# Patient Record
Sex: Female | Born: 1985 | Hispanic: No | Marital: Single | Smoking: Never smoker
Health system: Southern US, Community
[De-identification: ages and names within clinical notes are randomized; demographics above are authoritative.]

## PROBLEM LIST (undated history)

## (undated) DIAGNOSIS — R87612 Low grade squamous intraepithelial lesion on cytologic smear of cervix (LGSIL): Secondary | ICD-10-CM

## (undated) DIAGNOSIS — R87810 Cervical high risk human papillomavirus (HPV) DNA test positive: Secondary | ICD-10-CM

## (undated) DIAGNOSIS — N879 Dysplasia of cervix uteri, unspecified: Secondary | ICD-10-CM

## (undated) DIAGNOSIS — I10 Essential (primary) hypertension: Secondary | ICD-10-CM

## (undated) DIAGNOSIS — E041 Nontoxic single thyroid nodule: Secondary | ICD-10-CM

## (undated) DIAGNOSIS — R87619 Unspecified abnormal cytological findings in specimens from cervix uteri: Secondary | ICD-10-CM

## (undated) DIAGNOSIS — E7800 Pure hypercholesterolemia, unspecified: Secondary | ICD-10-CM

## (undated) DIAGNOSIS — R87610 Atypical squamous cells of undetermined significance on cytologic smear of cervix (ASC-US): Secondary | ICD-10-CM

## (undated) DIAGNOSIS — Z803 Family history of malignant neoplasm of breast: Secondary | ICD-10-CM

## (undated) DIAGNOSIS — E785 Hyperlipidemia, unspecified: Secondary | ICD-10-CM

## (undated) HISTORY — DX: Cervical high risk human papillomavirus (HPV) DNA test positive: R87.810

## (undated) HISTORY — PX: COLPOSCOPY: SHX161

## (undated) HISTORY — DX: Nontoxic single thyroid nodule: E04.1

## (undated) HISTORY — DX: Unspecified abnormal cytological findings in specimens from cervix uteri: R87.619

## (undated) HISTORY — DX: Atypical squamous cells of undetermined significance on cytologic smear of cervix (ASC-US): R87.610

## (undated) HISTORY — DX: Family history of malignant neoplasm of breast: Z80.3

## (undated) HISTORY — PX: CRYOTHERAPY: SHX1416

## (undated) HISTORY — DX: Essential (primary) hypertension: I10

## (undated) HISTORY — DX: Low grade squamous intraepithelial lesion on cytologic smear of cervix (LGSIL): R87.612

## (undated) HISTORY — DX: Pure hypercholesterolemia, unspecified: E78.00

## (undated) HISTORY — DX: Dysplasia of cervix uteri, unspecified: N87.9

## (undated) HISTORY — DX: Hyperlipidemia, unspecified: E78.5

---
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
## 2008-03-01 ENCOUNTER — Emergency Department: Payer: Self-pay | Admitting: Emergency Medicine

## 2008-03-01 ENCOUNTER — Other Ambulatory Visit: Payer: Self-pay

## 2008-03-12 ENCOUNTER — Encounter: Admission: RE | Admit: 2008-03-12 | Discharge: 2008-03-12 | Payer: Self-pay | Admitting: Family Medicine

## 2009-05-26 IMAGING — US US SOFT TISSUE HEAD/NECK
1 series · 14 of 25 positions shown · non-contrast
Comparison: None

CLINICAL DATA: Enlarged thyroid on physical exam

ULTRASOUND OF HEAD/NECK SOFT TISSUES
TECHNIQUE: Ultrasound examination of the head and neck soft tissues
was performed in the area of clinical concern.

[Series 1: us soft tissue head/neck · 0.08mm/px · 14 of 27 slices shown]
[im 1/27]
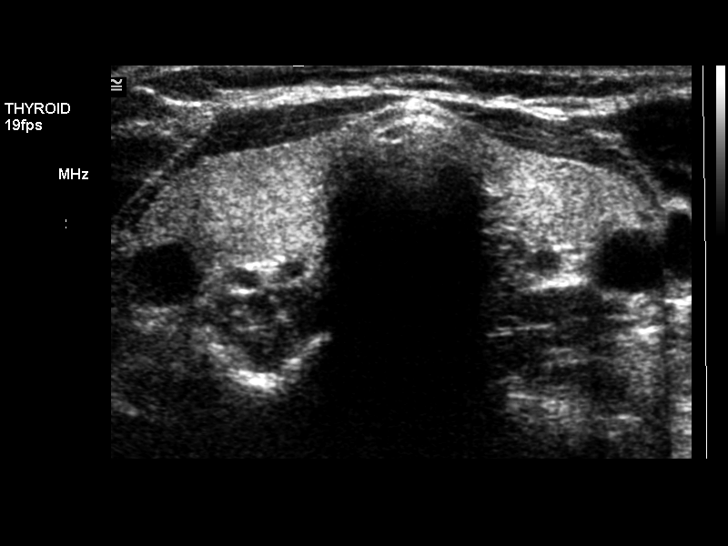
[im 3/27]
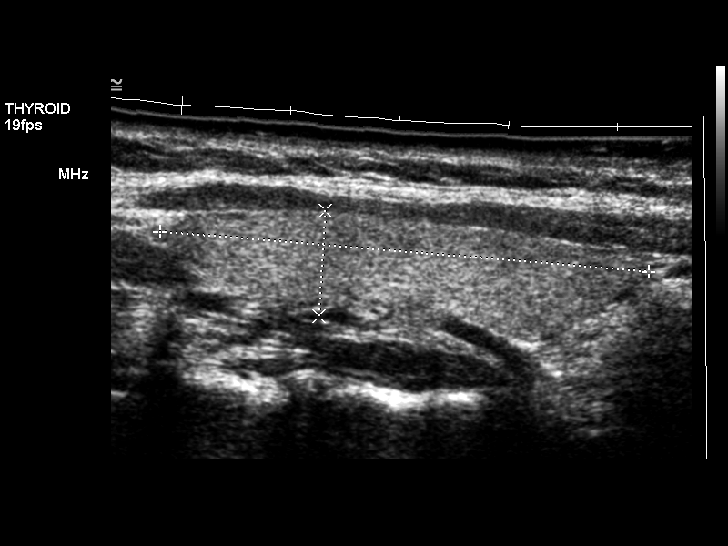
[im 5/27]
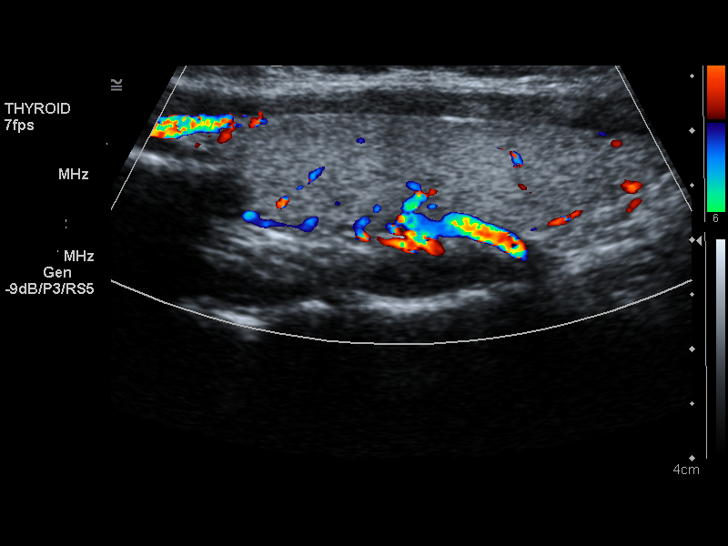
[im 7/27]
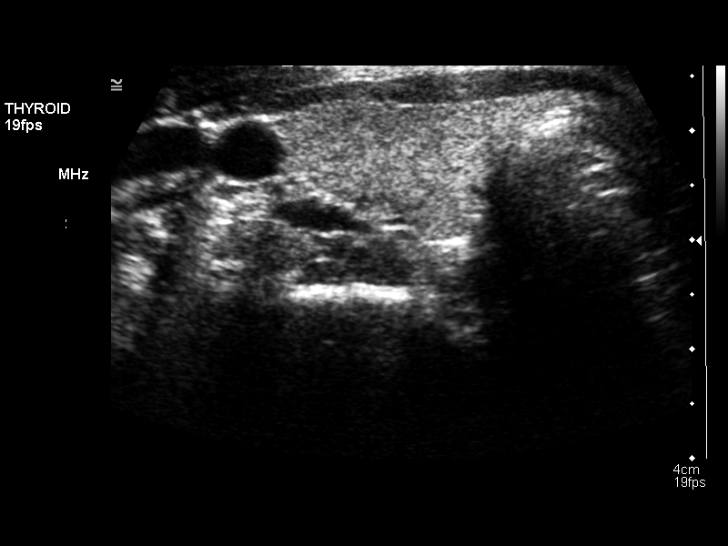
[im 9/27]
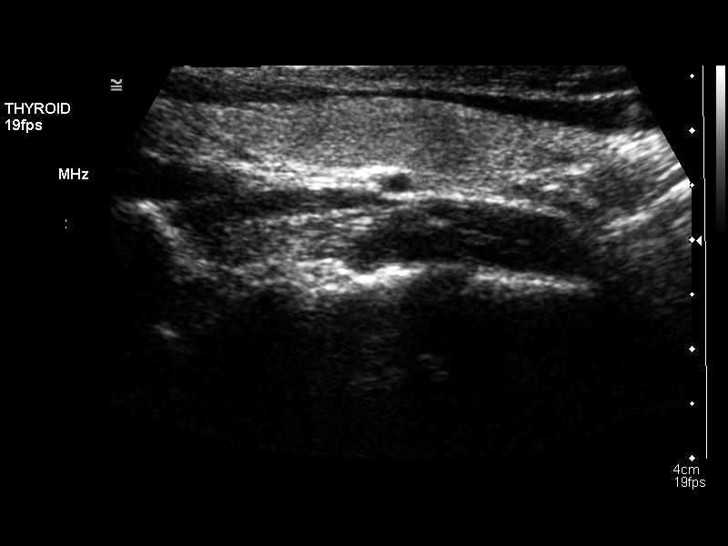
[im 10/27]
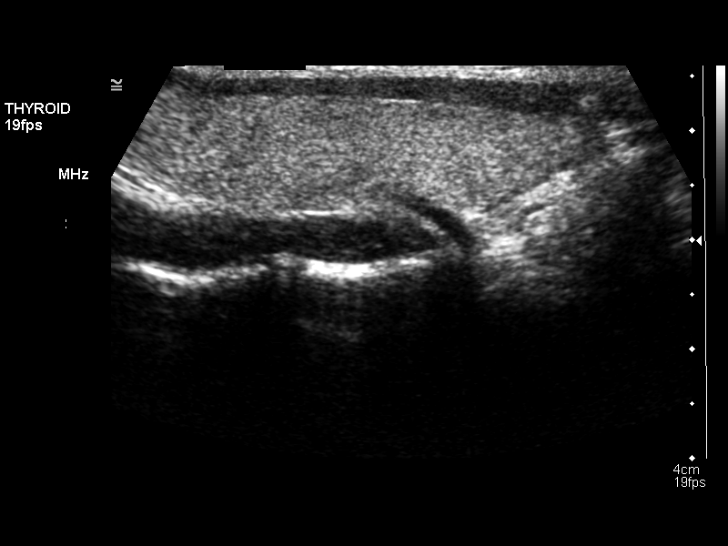
[im 12/27]
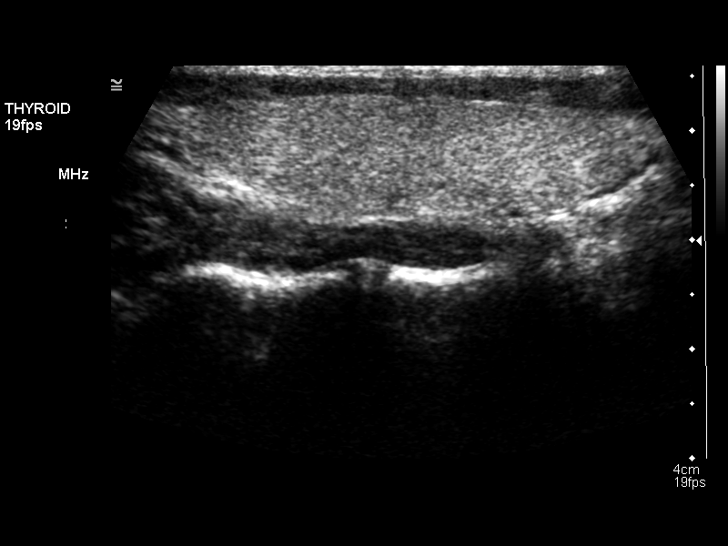
[im 15/27]
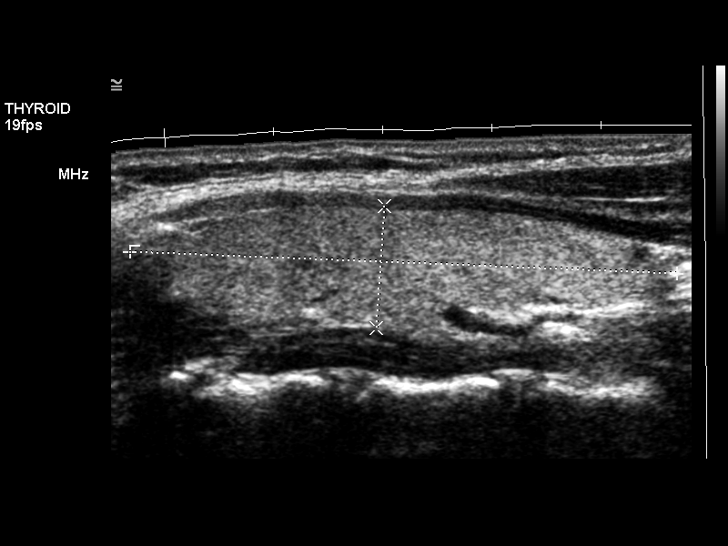
[im 17/27]
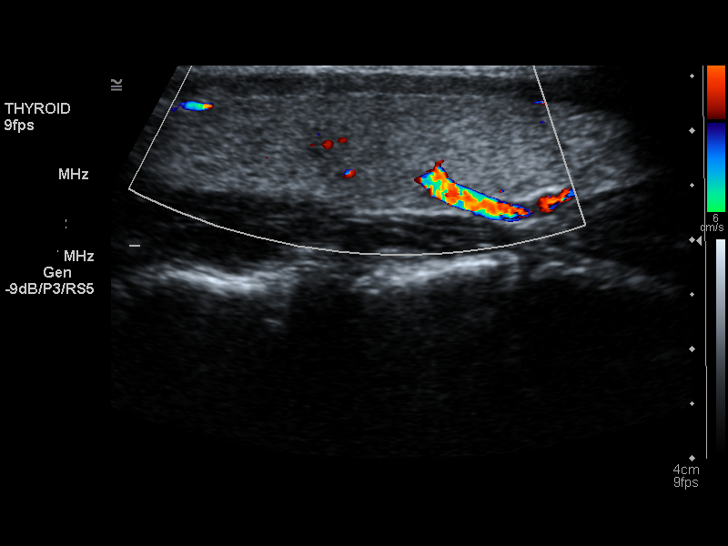
[im 18/27]
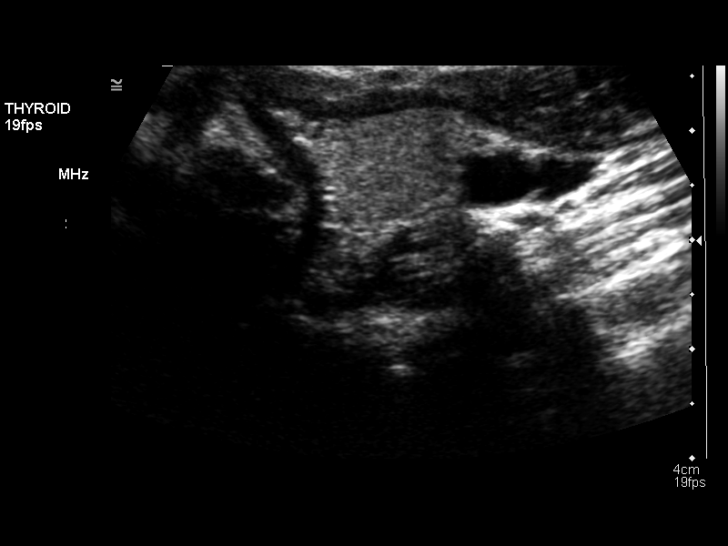
[im 20/27]
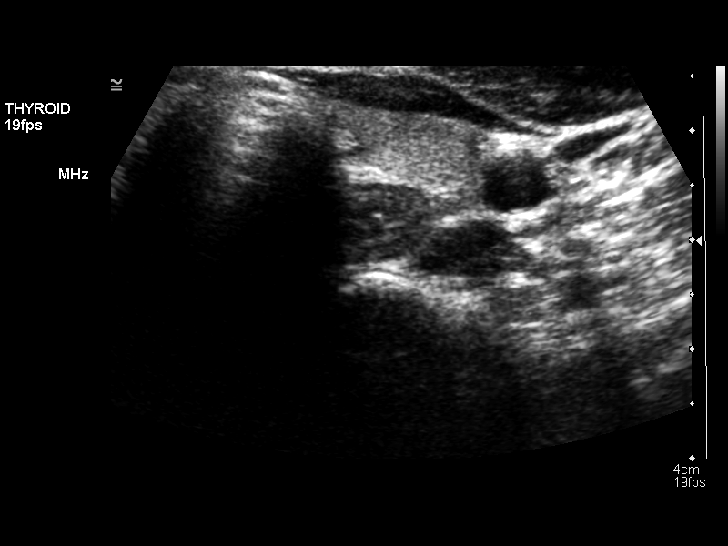
[im 22/27]
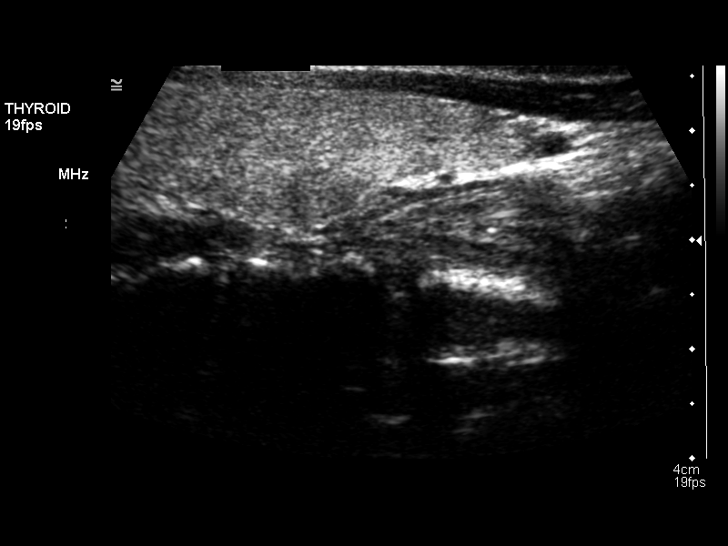
[im 24/27]
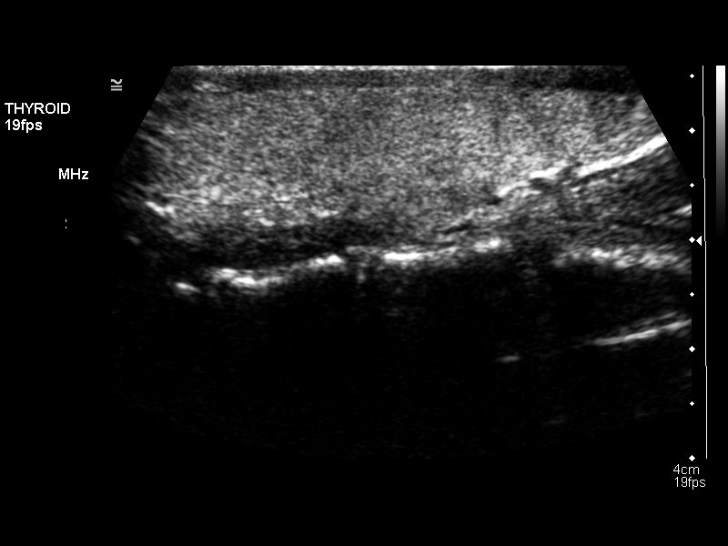
[im 27/27]
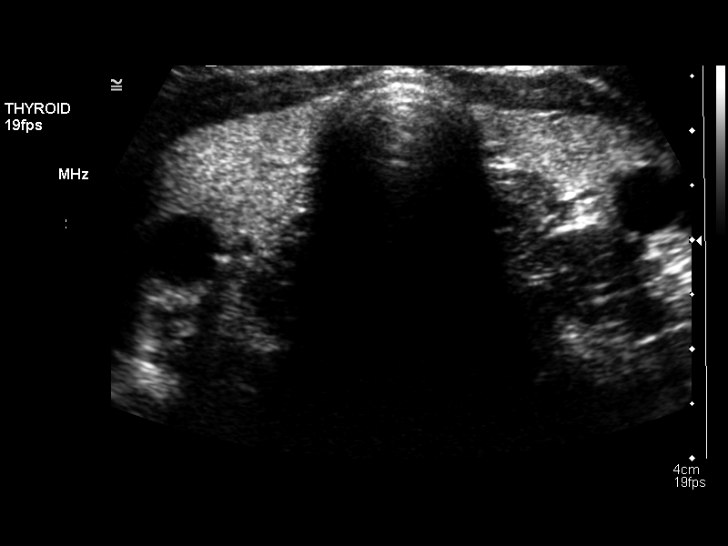

[14 of 25 positions shown; findings below may reference images not displayed]

FINDINGS: The thyroid gland is within normal limits in size.  The
right lobe measures 4.5 cm sagittally with a depth of 1.0 cm in
width of 2.0 cm.  The left lobe measures 5.0 x 1.1 x 1.9 cm with
the isthmus measuring 2.5 mm.  The gland is homogeneous and no
solid or cystic nodule is seen.
IMPRESSION: Thyroid gland is within normal limits in size with no solid or
cystic nodule.

## 2011-08-15 ENCOUNTER — Emergency Department: Payer: Self-pay | Admitting: Emergency Medicine

## 2013-11-13 ENCOUNTER — Emergency Department: Payer: Self-pay | Admitting: Emergency Medicine

## 2013-11-13 LAB — CBC
HCT: 37.7 % (ref 35.0–47.0)
HGB: 13 g/dL (ref 12.0–16.0)
MCH: 31.6 pg (ref 26.0–34.0)
MCHC: 34.4 g/dL (ref 32.0–36.0)
MCV: 92 fL (ref 80–100)
Platelet: 293 10*3/uL (ref 150–440)
RBC: 4.11 10*6/uL (ref 3.80–5.20)
RDW: 12.2 % (ref 11.5–14.5)
WBC: 7.8 10*3/uL (ref 3.6–11.0)

## 2013-11-13 LAB — BASIC METABOLIC PANEL
Anion Gap: 3 — ABNORMAL LOW (ref 7–16)
BUN: 13 mg/dL (ref 7–18)
Calcium, Total: 9.7 mg/dL (ref 8.5–10.1)
Chloride: 105 mmol/L (ref 98–107)
Co2: 27 mmol/L (ref 21–32)
Creatinine: 0.76 mg/dL (ref 0.60–1.30)
EGFR (African American): >60
EGFR (Non-African Amer.): >60
Glucose: 104 mg/dL — ABNORMAL HIGH (ref 65–99)
Osmolality: 271 (ref 275–301)
Potassium: 3.7 mmol/L (ref 3.5–5.1)
Sodium: 135 mmol/L — ABNORMAL LOW (ref 136–145)

## 2013-11-13 LAB — TROPONIN I: Troponin-I: <0.02 ng/mL

## 2014-03-26 ENCOUNTER — Ambulatory Visit: Payer: Self-pay | Admitting: Nurse Practitioner

## 2015-09-03 ENCOUNTER — Other Ambulatory Visit: Payer: Self-pay | Admitting: Obstetrics and Gynecology

## 2015-09-03 DIAGNOSIS — E010 Iodine-deficiency related diffuse (endemic) goiter: Secondary | ICD-10-CM

## 2015-09-06 ENCOUNTER — Other Ambulatory Visit: Payer: Self-pay

## 2015-09-10 ENCOUNTER — Ambulatory Visit
Admission: RE | Admit: 2015-09-10 | Discharge: 2015-09-10 | Disposition: A | Discharge status: Home / Self Care | Payer: No Typology Code available for payment source | Source: Ambulatory Visit | Attending: Obstetrics and Gynecology | Admitting: Obstetrics and Gynecology

## 2015-09-10 DIAGNOSIS — E049 Nontoxic goiter, unspecified: Secondary | ICD-10-CM | POA: Diagnosis not present

## 2015-09-10 DIAGNOSIS — E010 Iodine-deficiency related diffuse (endemic) goiter: Secondary | ICD-10-CM

## 2016-03-03 LAB — HM PAP SMEAR: HM PAP: ABNORMAL

## 2016-11-24 ENCOUNTER — Other Ambulatory Visit: Payer: Self-pay | Admitting: Obstetrics and Gynecology

## 2016-11-24 DIAGNOSIS — E010 Iodine-deficiency related diffuse (endemic) goiter: Secondary | ICD-10-CM

## 2016-11-27 ENCOUNTER — Ambulatory Visit: Payer: No Typology Code available for payment source

## 2016-12-01 ENCOUNTER — Ambulatory Visit
Admission: RE | Admit: 2016-12-01 | Discharge: 2016-12-01 | Disposition: A | Discharge status: Home / Self Care | Payer: Self-pay | Source: Ambulatory Visit | Attending: Obstetrics and Gynecology | Admitting: Obstetrics and Gynecology

## 2016-12-01 DIAGNOSIS — E010 Iodine-deficiency related diffuse (endemic) goiter: Secondary | ICD-10-CM

## 2016-12-01 DIAGNOSIS — E049 Nontoxic goiter, unspecified: Secondary | ICD-10-CM | POA: Insufficient documentation

## 2017-06-15 IMAGING — US US SOFT TISSUE HEAD/NECK
1 series · 14 of 25 positions shown · non-contrast
Comparison: 03/26/2014

CLINICAL DATA: Thyromegaly.

EXAM:
THYROID ULTRASOUND
TECHNIQUE: Ultrasound examination of the thyroid gland and adjacent soft
tissues was performed.

[Series 1: us soft tissue head/neck · 0.07mm/px · 14 of 41 slices shown]
[im 1/41]
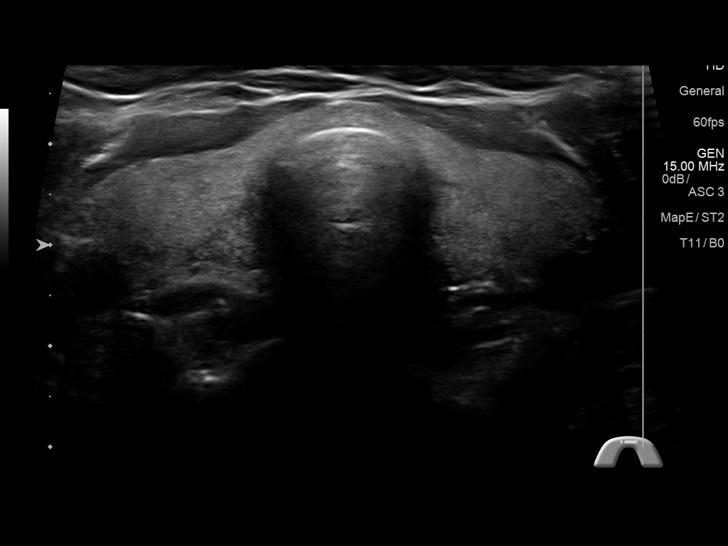
[im 4/41]
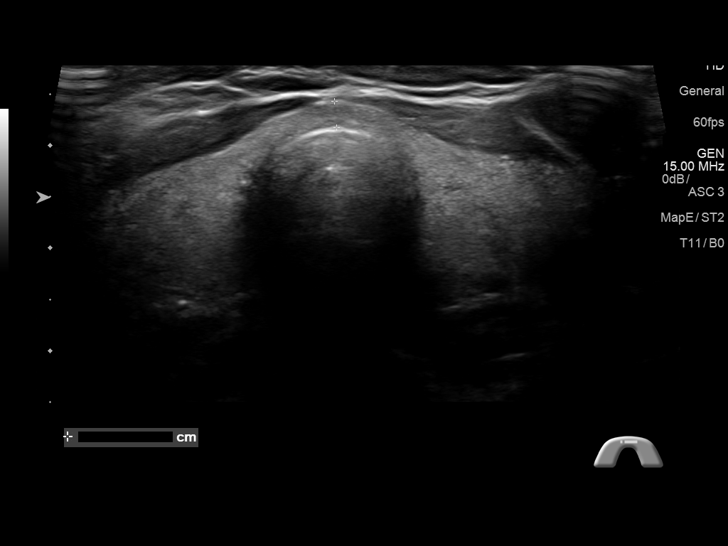
[im 7/41]
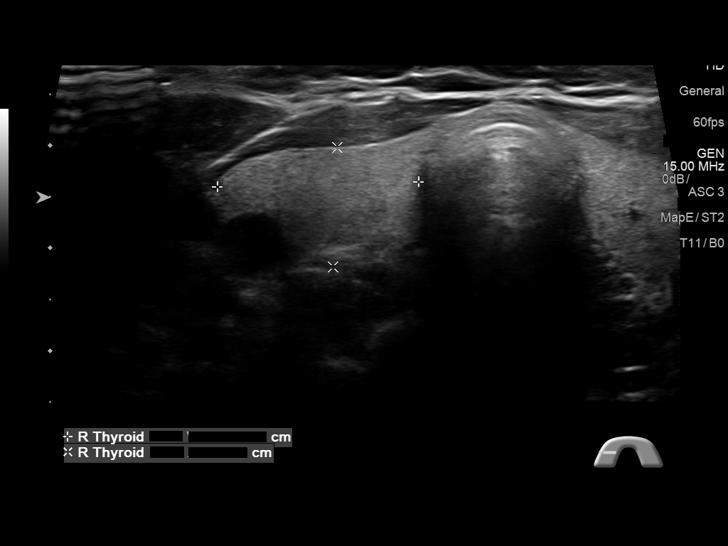
[im 11/41]
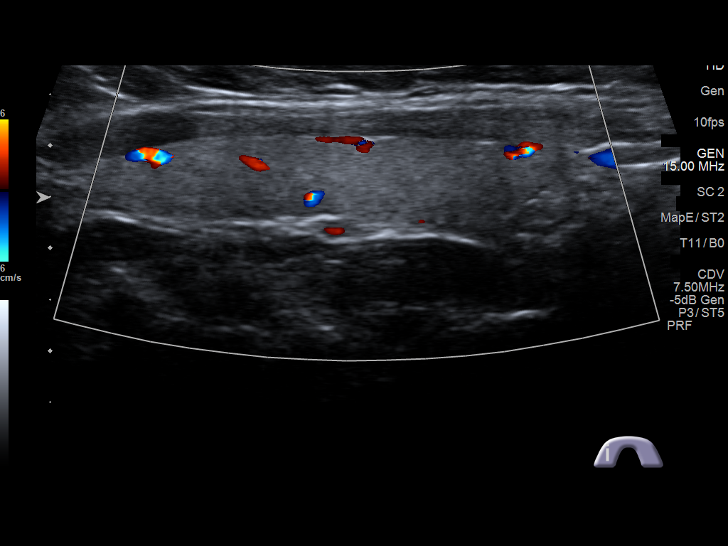
[im 14/41]
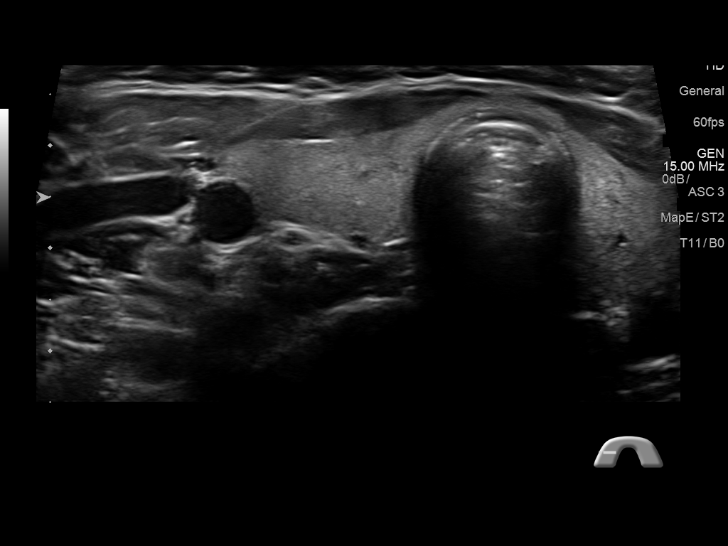
[im 16/41]
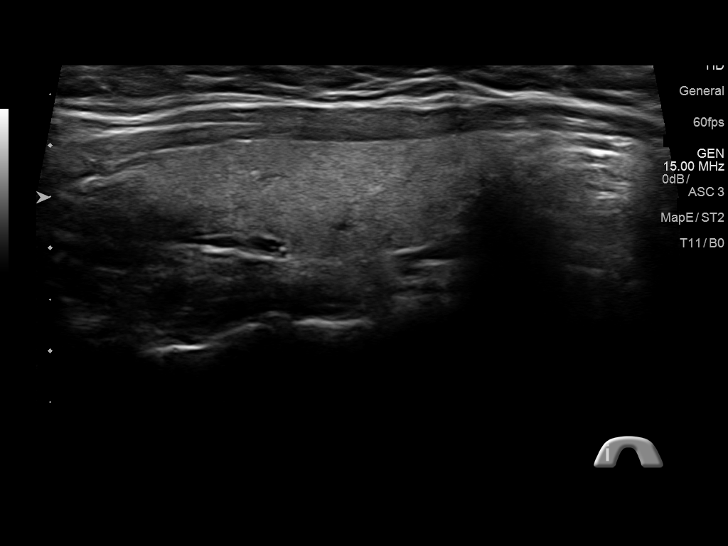
[im 19/41]
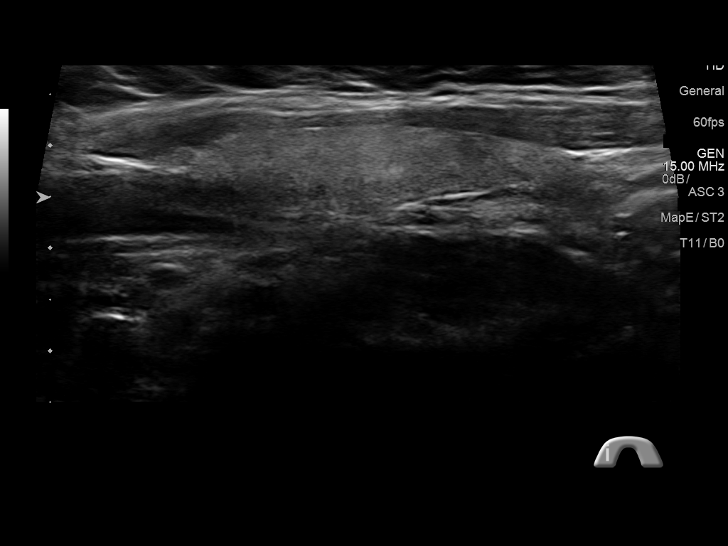
[im 22/41]
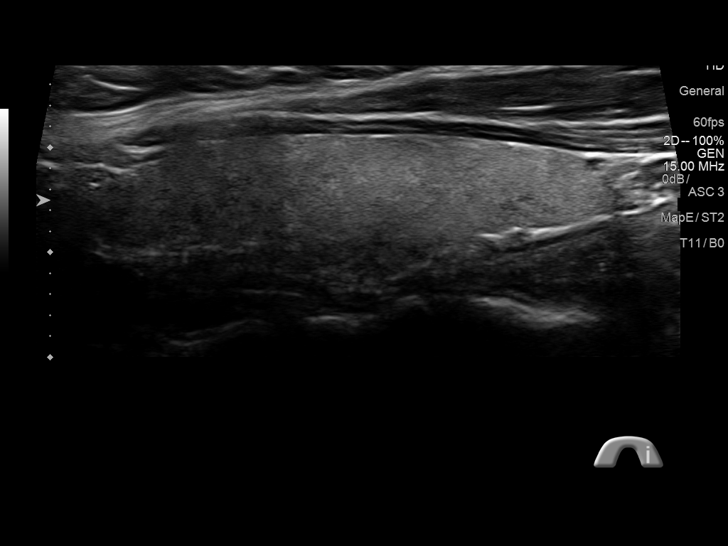
[im 26/41]
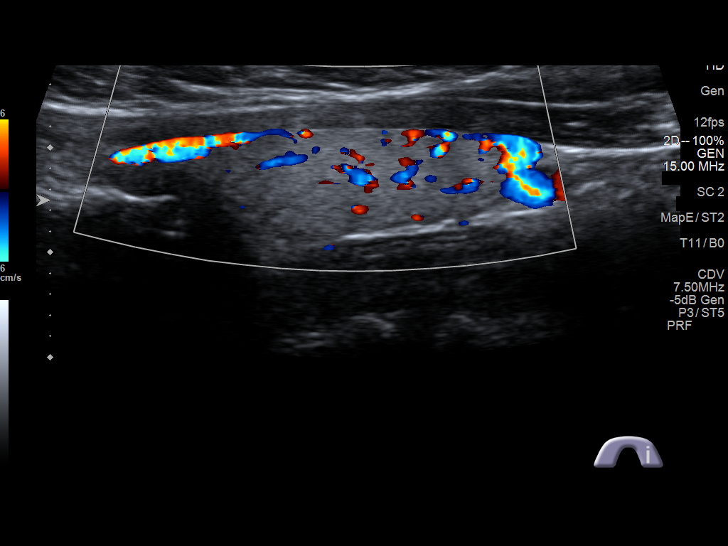
[im 27/41]
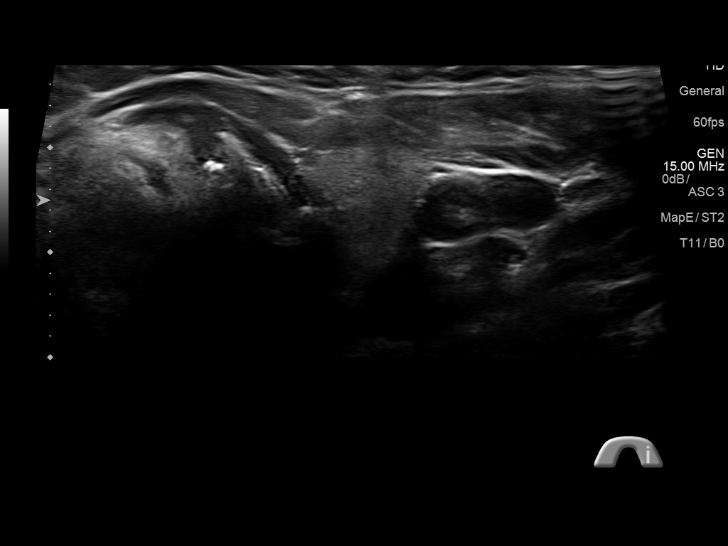
[im 31/41]
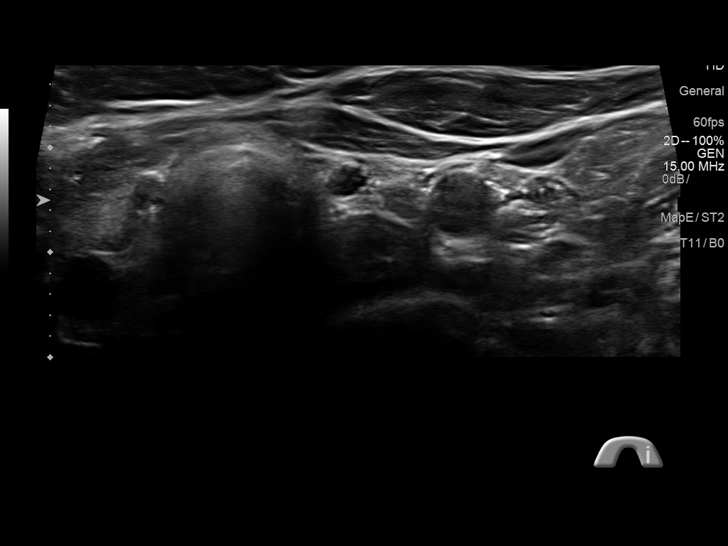
[im 34/41]
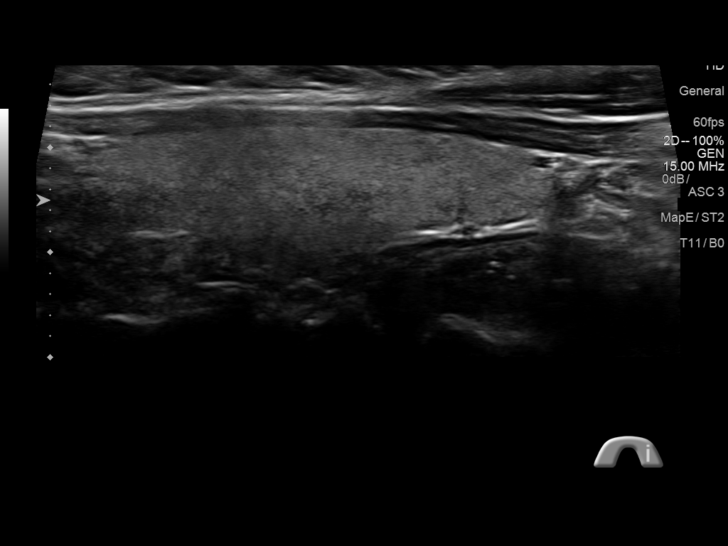
[im 37/41]
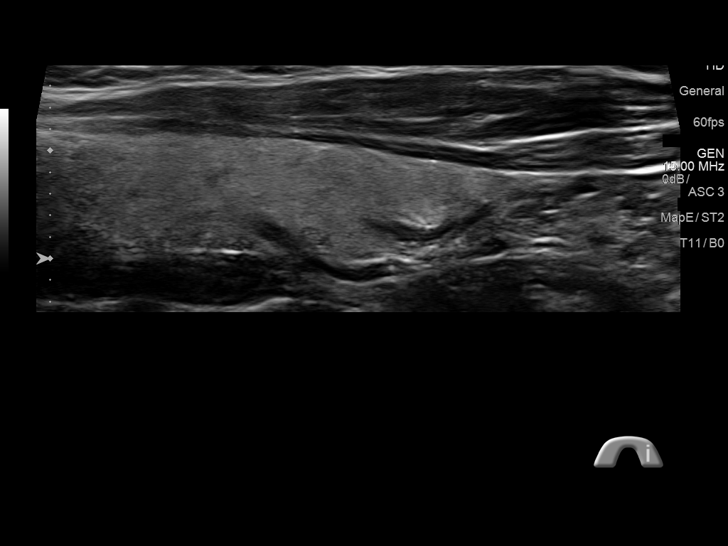
[im 41/41]
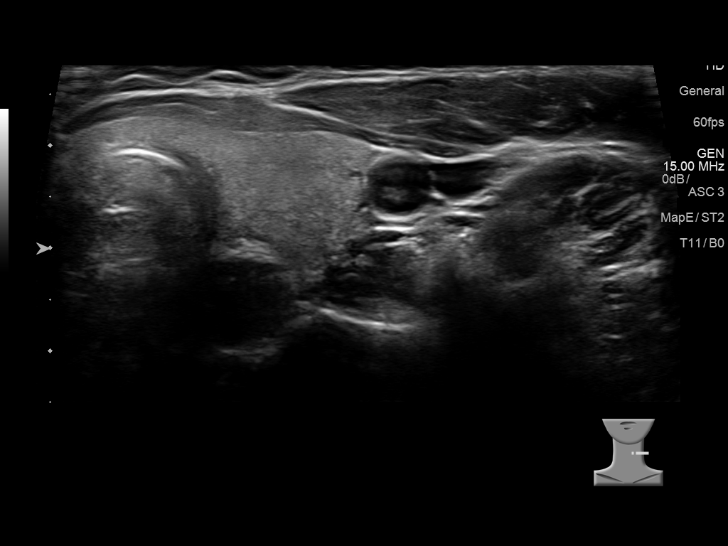

[14 of 25 positions shown; findings below may reference images not displayed]

FINDINGS: Right thyroid lobe

Measurements: 4.7 x 1.2 x 2.0 cm. Right thyroid lobe is homogeneous
without a distinct nodule.

Left thyroid lobe

Measurements: 5.2 x 1.4 x 1.9 cm. There is a stable hyperechoic area
along the inferior aspect of the left thyroid lobe with ill-defined
margins. Unclear if this represents a true nodule. This area
measures up to 0.5 cm and minimally changed.

Isthmus

Thickness: 0.3 cm.  No nodules visualized.

Lymphadenopathy

None visualized.
IMPRESSION: Stable small hyperechoic area along the inferior left thyroid lobe.
This small structure is not well defined and may represent a pseudo
nodule.

## 2017-12-09 ENCOUNTER — Telehealth: Payer: Self-pay | Admitting: Obstetrics and Gynecology

## 2017-12-09 ENCOUNTER — Encounter: Payer: Self-pay | Admitting: Obstetrics and Gynecology

## 2017-12-09 ENCOUNTER — Ambulatory Visit (INDEPENDENT_AMBULATORY_CARE_PROVIDER_SITE_OTHER): Payer: No Typology Code available for payment source | Admitting: Obstetrics and Gynecology

## 2017-12-09 VITALS — BP 148/80 | HR 111 | Ht 62.0 in | Wt 152.0 lb

## 2017-12-09 DIAGNOSIS — Z803 Family history of malignant neoplasm of breast: Secondary | ICD-10-CM | POA: Insufficient documentation

## 2017-12-09 DIAGNOSIS — Z1151 Encounter for screening for human papillomavirus (HPV): Secondary | ICD-10-CM

## 2017-12-09 DIAGNOSIS — Z01419 Encounter for gynecological examination (general) (routine) without abnormal findings: Secondary | ICD-10-CM

## 2017-12-09 DIAGNOSIS — E041 Nontoxic single thyroid nodule: Secondary | ICD-10-CM | POA: Diagnosis not present

## 2017-12-09 DIAGNOSIS — Z87898 Personal history of other specified conditions: Secondary | ICD-10-CM | POA: Diagnosis not present

## 2017-12-09 DIAGNOSIS — Z8742 Personal history of other diseases of the female genital tract: Secondary | ICD-10-CM

## 2017-12-09 DIAGNOSIS — A630 Anogenital (venereal) warts: Secondary | ICD-10-CM | POA: Diagnosis not present

## 2017-12-09 DIAGNOSIS — Z124 Encounter for screening for malignant neoplasm of cervix: Secondary | ICD-10-CM | POA: Diagnosis not present

## 2017-12-09 NOTE — Telephone Encounter (Signed)
Insurance came back twice saying inactive. Pt called [employer] and I spoke with Roxanne and she verified she does have insurance and it is active. Pt is aware if bill is sent to contact her insurance company.

## 2017-12-09 NOTE — Progress Notes (Signed)
PCP:  Patient, No Pcp Per   Chief Complaint  Patient presents with  . Gynecologic Exam     HPI:      [name] is a 31 y.o. No obstetric history on file. who LMP was Patient's last menstrual period was 12/05/2017., presents today for her annual examination.  Her menses are regular every 28-30 days, lasting 5 days.  Dysmenorrhea mild. She does not have intermenstrual bleeding.  Sex activity: not sexually active.  Last Pap: November 24, 2016  Results were: no abnormalities /POS HPV DNA. Pt with LGSIL and neg bx 4/17. Repeat pap due today. Hx of STDs: HPV. Pt notes 2 bumps on mons for the past yr.  There is a FH of breast cancer in her mat aunt, in 2640s or 4550s. 33 yo sister had neg genetic testing done in the past but recently had abn mammo and is sched for bx 1/19. There is no FH of ovarian cancer. The patient does do self-breast exams.  Tobacco use: The patient denies current or previous tobacco use. Alcohol use: none No drug use.  Exercise: not active  She does get adequate calcium but not Vitamin D in her diet.   Pt with hx of thyromegaly and thyroid nodules 2015/2016. Had u/s f/u last yr that was normal without thyromegaly/nodules.    Past Medical History:  Diagnosis Date  . Abnormal Pap smear of cervix   . ASCUS with positive high risk HPV cervical   . Cervical dysplasia   . LGSIL on Pap smear of cervix   . Thyroid nodule     Past Surgical History:  Procedure Laterality Date  . COLPOSCOPY    . CRYOTHERAPY      Family History  Problem Relation Age of Onset  . Hypertension Mother   . Thyroid disease Mother   . Multiple sclerosis Sister   . Breast cancer Maternal Aunt        40s or 7150s  . Colon cancer Paternal Aunt     Social History   Socioeconomic History  . Marital status: Single    Spouse name: Not on file  . Number of children: Not on file  . Years of education: Not on file  . Highest education level: Not on file  Social Needs  . Financial  resource strain: Not on file  . Food insecurity - worry: Not on file  . Food insecurity - inability: Not on file  . Transportation needs - medical: Not on file  . Transportation needs - non-medical: Not on file  Occupational History  . Not on file  Tobacco Use  . Smoking status: Never Smoker  . Smokeless tobacco: Never Used  Substance and Sexual Activity  . Alcohol use: No    Frequency: Never  . Drug use: No  . Sexual activity: Not Currently    Birth control/protection: None  Other Topics Concern  . Not on file  Social History Narrative  . Not on file    No outpatient medications have been marked as taking for the 12/09/17 encounter (Office Visit) with Copland, Ilona SorrelAlicia B, PA-C.     ROS:  Review of Systems  Constitutional: Negative for fatigue, fever and unexpected weight change.  Respiratory: Negative for cough, shortness of breath and wheezing.   Cardiovascular: Negative for chest pain, palpitations and leg swelling.  Gastrointestinal: Negative for blood in stool, constipation, diarrhea, nausea and vomiting.  Endocrine: Negative for cold intolerance, heat intolerance and polyuria.  Genitourinary: Negative for  dyspareunia, dysuria, flank pain, frequency, genital sores, hematuria, menstrual problem, pelvic pain, urgency, vaginal bleeding, vaginal discharge and vaginal pain.  Musculoskeletal: Negative for back pain, joint swelling and myalgias.  Skin: Negative for rash.  Neurological: Positive for headaches. Negative for dizziness, syncope, light-headedness and numbness.  Hematological: Negative for adenopathy.  Psychiatric/Behavioral: Negative for agitation, confusion, sleep disturbance and suicidal ideas. The patient is not nervous/anxious.      Objective: BP (!) 148/80   Pulse (!) 111   Ht 5\' 2"  (1.575 m)   Wt 152 lb (68.9 kg)   LMP 12/05/2017   BMI 27.80 kg/m    Physical Exam  Constitutional: She is oriented to person, place, and time. She appears well-developed  and well-nourished.  Genitourinary: Vagina normal and uterus normal. There is no rash or tenderness on the right labia. There is no rash or tenderness on the left labia.    No erythema or tenderness in the vagina. No vaginal discharge found. Right adnexum does not display mass and does not display tenderness. Left adnexum does not display mass and does not display tenderness. Cervix does not exhibit motion tenderness or polyp. Uterus is not enlarged or tender.  Genitourinary Comments: 2 WARTY LESIONS ON MONS  Neck: Normal range of motion. No thyromegaly present.  Cardiovascular: Normal rate, regular rhythm and normal heart sounds.  No murmur heard. Pulmonary/Chest: Effort normal and breath sounds normal. Right breast exhibits no mass, no nipple discharge, no skin change and no tenderness. Left breast exhibits no mass, no nipple discharge, no skin change and no tenderness.  Abdominal: Soft. There is no tenderness. There is no guarding.  Musculoskeletal: Normal range of motion.  Neurological: She is alert and oriented to person, place, and time. No cranial nerve deficit.  Psychiatric: She has a normal mood and affect. Her behavior is normal.  Vitals reviewed.  Assessment/Plan: Encounter for annual routine gynecological examination  Cervical cancer screening - Plan: IGP, Aptima HPV  Screening for HPV (human papillomavirus) - Plan: IGP, Aptima HPV  History of abnormal cervical Pap smear - Repeat pap today. Will f/u with results. If abn, pt due for repeat colpo.  Family history of breast cancer - Pt to clarify age of dx of mat aunt with breast cancer, as well as f/u on sister's bx report. Pt will f/u with me and will do MyRisk testing if qualifies.  Genital warts - Treated with TCA today. Wash in 4-6hrs. F/u prn.   Thyroid nodule - Resolved on thryoid u/s 12/17.  Nothing further to do at this time.    GYN counsel breast self exam, adequate intake of calcium and vitamin D, diet and  exercise     F/U  Return in about 1 year (around 12/09/2018).  Alicia B. Copland, PA-C 12/09/2017 10:57 AM

## 2017-12-09 NOTE — Patient Instructions (Signed)
I value your feedback and entrusting us with your care. If you get a [facility] patient survey, I would appreciate you taking the time to let us know about your experience today. Thank you! 

## 2017-12-11 LAB — IGP, APTIMA HPV
HPV APTIMA: NEGATIVE
PAP Smear Comment: 0

## 2018-02-21 ENCOUNTER — Telehealth: Payer: Self-pay

## 2018-02-21 NOTE — Telephone Encounter (Signed)
Pt calling to see if we can see her for a sinus inf.  [phone].  Left detailed msg to see PCP, [facility], or Urgent Care as we do not do primary care.

## 2018-09-06 IMAGING — US US THYROID
1 series · 13 of 25 positions shown · non-contrast
Comparison: Thyroid ultrasound - 09/10/2015; 03/26/2014; 03/12/2008

CLINICAL DATA: Other.  Thyromegaly.

EXAM:
THYROID ULTRASOUND
TECHNIQUE: Ultrasound examination of the thyroid gland and adjacent soft
tissues was performed.

[Series 1: us thyroid · 0.07mm/px · 13 of 59 slices shown]
[im 1/59]
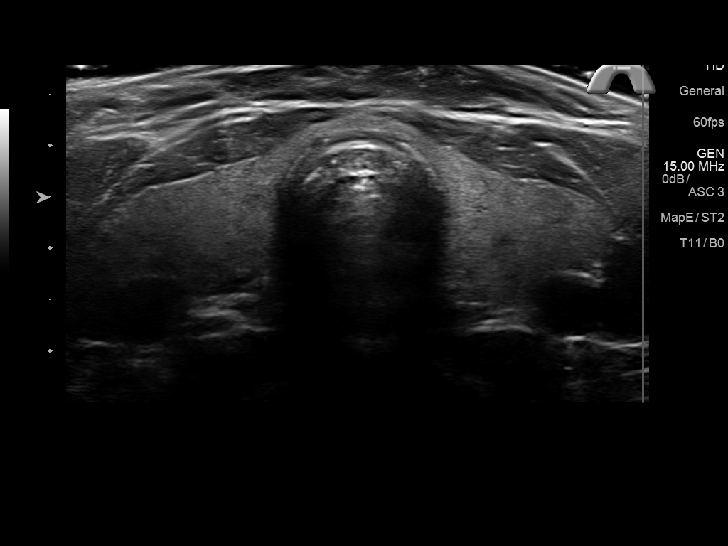
[im 5/59]
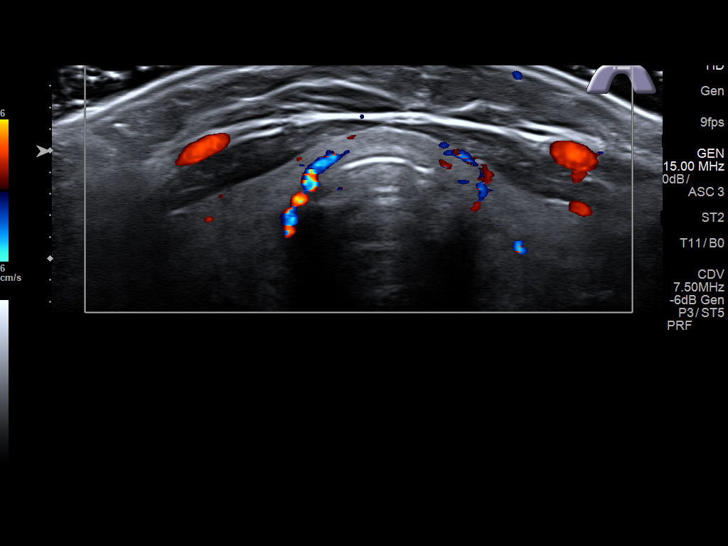
[im 10/59]
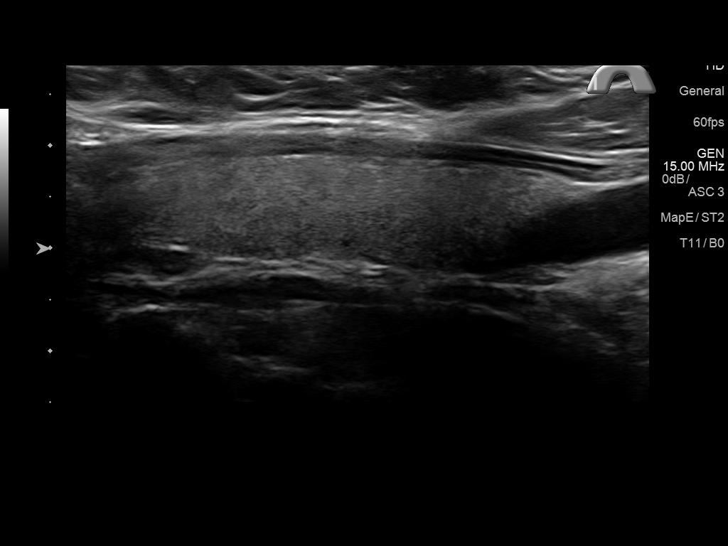
[im 15/59]
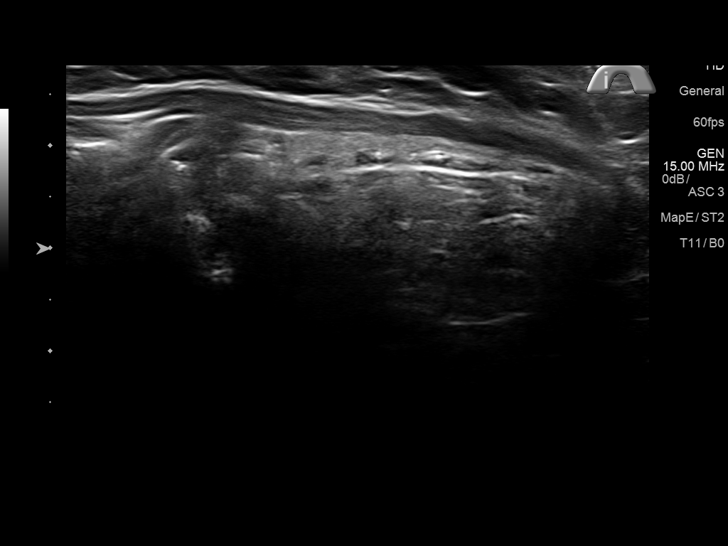
[im 20/59]
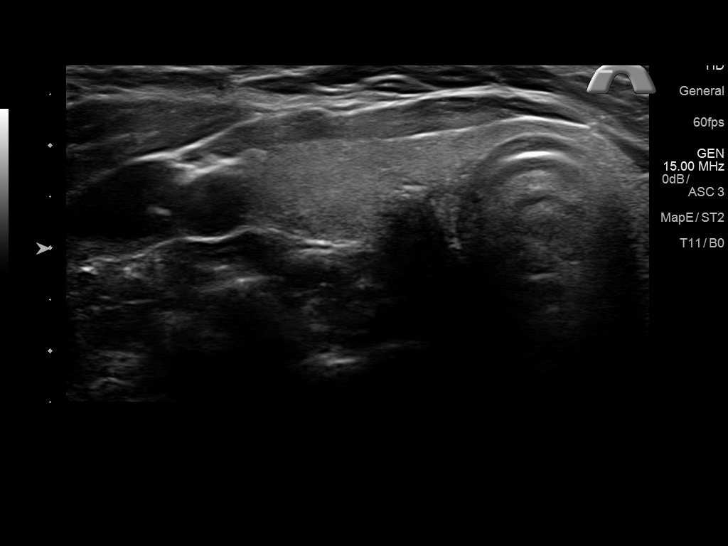
[im 25/59]
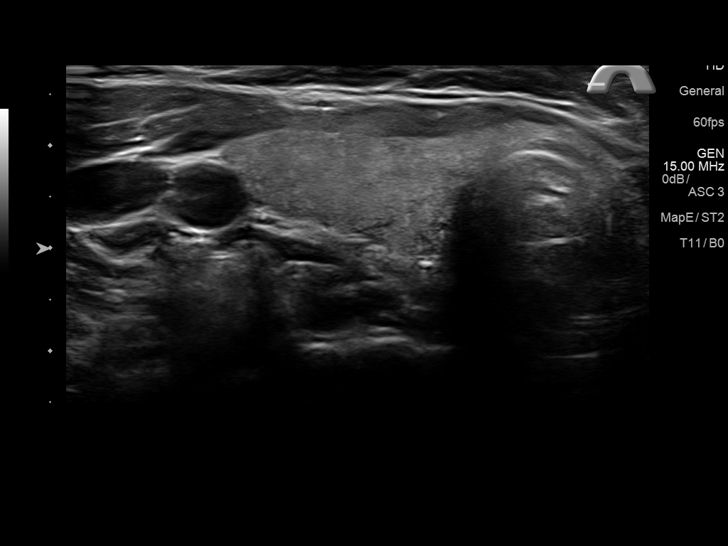
[im 30/59]
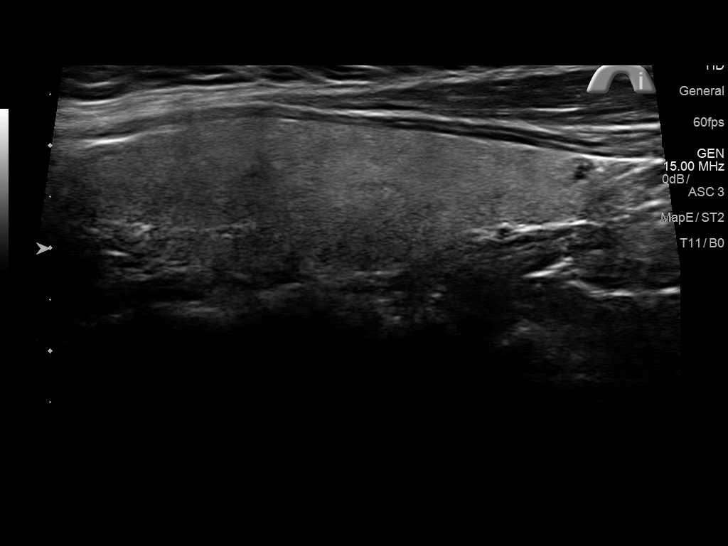
[im 34/59]
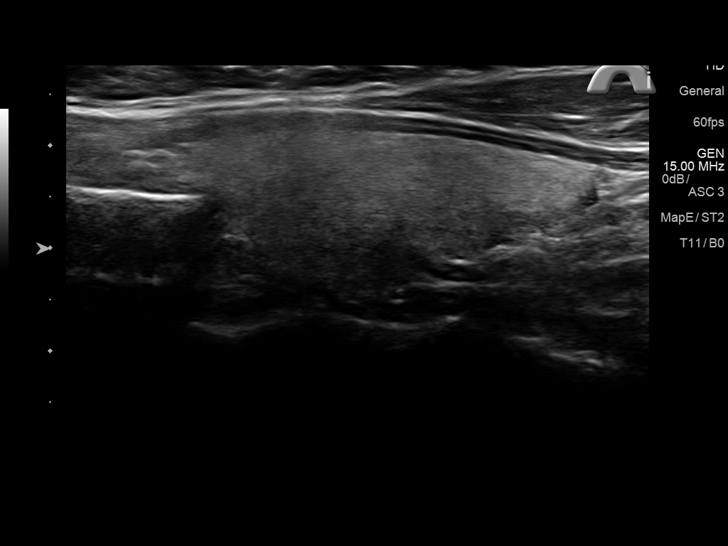
[im 39/59]
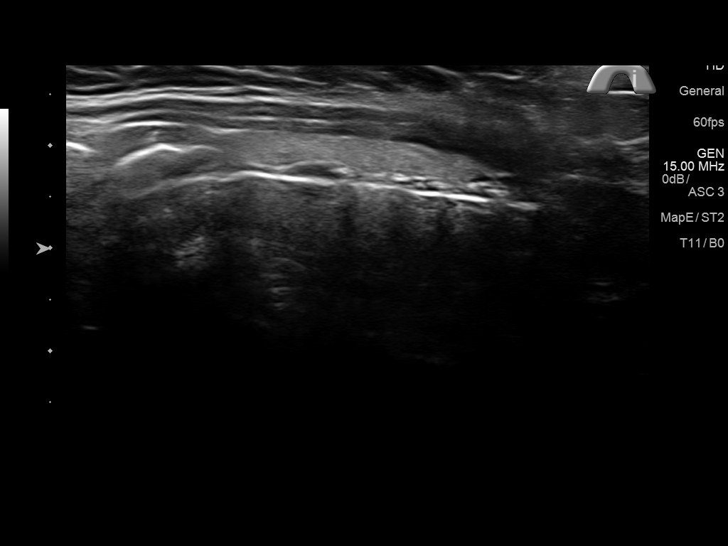
[im 44/59]
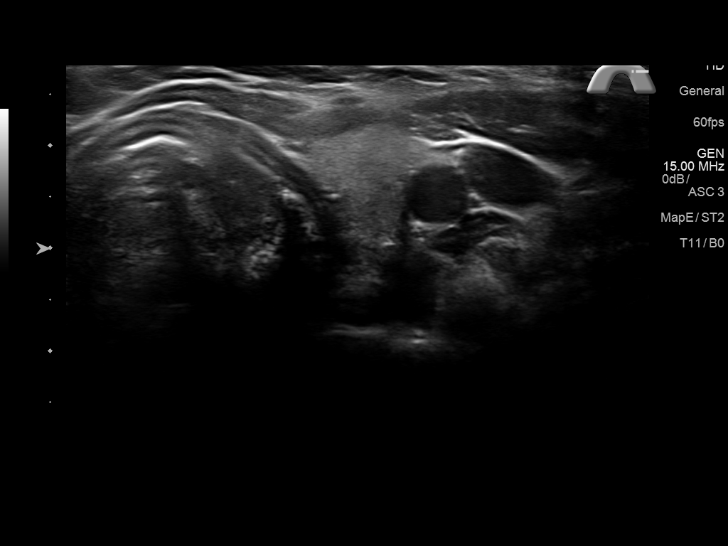
[im 49/59]
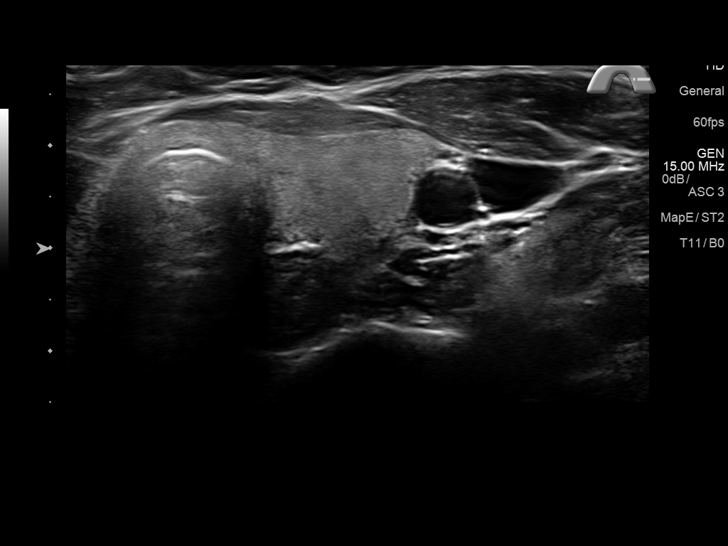
[im 54/59]
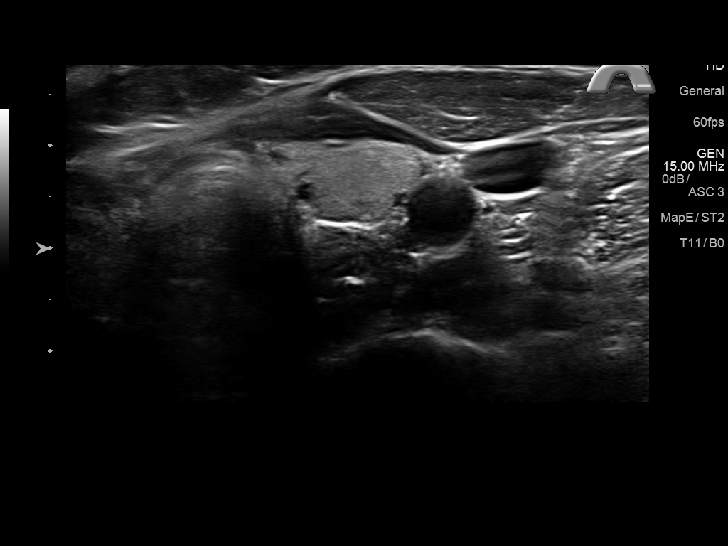
[im 59/59]
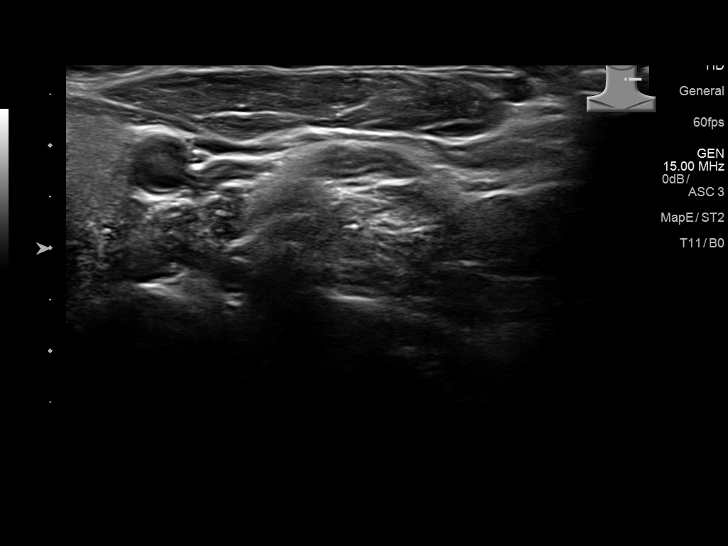

[13 of 25 positions shown; findings below may reference images not displayed]

FINDINGS: Parenchymal Echotexture: Mildly heterogenous

Estimated total number of nodules >/= 1 cm: 0

Number of spongiform nodules >/=  2 cm not described below (TR1): 0

Number of mixed cystic and solid nodules >/= 1.5 cm not described
below (TR2): 0

_________________________________________________________

Isthmus: Normal in size measures 0.2 cm in diameter, unchanged

No discrete nodules are identified within the thyroid isthmus.

_________________________________________________________

Right lobe: Normal in size measuring 5.1 x 1.2 x 2.2 cm, unchanged,
previously, 4.7 x 1.2 x 2.0 cm with slight differences likely
attributable to scan plane projection.

No discrete nodules are identified within the right lobe of the
thyroid.

_________________________________________________________

Left lobe: Normal in size measuring 5.2 x 1.4 x 2.0 cm, unchanged,
previously, 5.2 x 1.4 x 1.9 cm with slight differences likely
attributable to scan plane projection.

No discrete nodules are identified within the left lobe of the
thyroid.
IMPRESSION: Normal thyroid ultrasound. Specifically, no evidence of thyromegaly
or discrete thyroid nodule or mass.

## 2018-12-19 ENCOUNTER — Other Ambulatory Visit (HOSPITAL_COMMUNITY)
Admission: RE | Admit: 2018-12-19 | Discharge: 2018-12-19 | Disposition: A | Discharge status: Home / Self Care | Payer: No Typology Code available for payment source | Source: Ambulatory Visit | Attending: Obstetrics and Gynecology | Admitting: Obstetrics and Gynecology

## 2018-12-19 ENCOUNTER — Ambulatory Visit (INDEPENDENT_AMBULATORY_CARE_PROVIDER_SITE_OTHER): Payer: PRIVATE HEALTH INSURANCE | Admitting: Obstetrics and Gynecology

## 2018-12-19 ENCOUNTER — Encounter: Payer: Self-pay | Admitting: Obstetrics and Gynecology

## 2018-12-19 VITALS — BP 140/90 | HR 87 | Ht 62.0 in | Wt 150.0 lb

## 2018-12-19 DIAGNOSIS — N898 Other specified noninflammatory disorders of vagina: Secondary | ICD-10-CM | POA: Diagnosis not present

## 2018-12-19 DIAGNOSIS — Z124 Encounter for screening for malignant neoplasm of cervix: Secondary | ICD-10-CM | POA: Insufficient documentation

## 2018-12-19 DIAGNOSIS — Z1151 Encounter for screening for human papillomavirus (HPV): Secondary | ICD-10-CM

## 2018-12-19 DIAGNOSIS — Z01419 Encounter for gynecological examination (general) (routine) without abnormal findings: Secondary | ICD-10-CM

## 2018-12-19 DIAGNOSIS — A630 Anogenital (venereal) warts: Secondary | ICD-10-CM

## 2018-12-19 LAB — POCT WET PREP WITH KOH
CLUE CELLS WET PREP PER HPF POC: NEGATIVE
KOH Prep POC: NEGATIVE
Trichomonas, UA: NEGATIVE
YEAST WET PREP PER HPF POC: NEGATIVE

## 2018-12-19 NOTE — Progress Notes (Signed)
PCP:  Patient, No Pcp Per   Chief Complaint  Patient presents with  . Gynecologic Exam     HPI:      Cindy Conley is a 33 y.o. No obstetric history on file. who LMP was Patient's last menstrual period was 12/03/2018 (approximate)., presents today for her annual examination.  Her menses are regular every 28-30 days, lasting 5 days.  Dysmenorrhea mild. She does not have intermenstrual bleeding.  Sex activity: not sexually active.  Last Pap: 12/09/17  Results were: no abnormalities /neg HPV DNA. Pt with LGSIL and neg bx 4/17. Repeat pap due today. 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  Pt treated for 2 genital warts on mons last yr with TCA. Sx resolved but pt has found a couple new lesions. Wants tx again today.  Pt with vaginal irritation recently, treating with lotrimin with sx improvement. Uses dove sens skin soap/no dryer sheets.   There is a FH of breast cancer in her mat aunt, genetic testing not indicated. There is no FH of ovarian cancer. The patient does do self-breast exams.  Tobacco use: The patient denies current or previous tobacco use. Alcohol use: none No drug use.  Exercise: not active  She does get adequate calcium but not Vitamin D in her diet.   Pt with hx of thyromegaly and thyroid nodules 2015/2016. Had u/s f/u 2017 that was normal without thyromegaly/nodules. Nothing further to do at this time.   Past Medical History:  Diagnosis Date  . Abnormal Pap smear of cervix   . ASCUS with positive high risk HPV cervical   . Cervical dysplasia   . LGSIL on Pap smear of cervix   . Thyroid nodule     Past Surgical History:  Procedure Laterality Date  . COLPOSCOPY    . CRYOTHERAPY      Family History  Problem Relation Age of Onset  . Hypertension Mother   . Thyroid disease Mother   . Hyperthyroidism Mother   . Multiple sclerosis Sister   . Breast cancer Maternal Aunt        70s  . Colon cancer Paternal Aunt     Social History    Socioeconomic History  . Marital status: Single    Spouse name: Not on file  . Number of children: Not on file  . Years of education: Not on file  . Highest education level: Not on file  Occupational History  . Not on file  Social Needs  . Financial resource strain: Not on file  . Food insecurity:    Worry: Not on file    Inability: Not on file  . Transportation needs:    Medical: Not on file    Non-medical: Not on file  Tobacco Use  . Smoking status: Never Smoker  . Smokeless tobacco: Never Used  Substance and Sexual Activity  . Alcohol use: Yes    Frequency: Never    Comment: occ  . Drug use: No  . Sexual activity: Not Currently    Birth control/protection: None  Lifestyle  . Physical activity:    Days per week: Not on file    Minutes per session: Not on file  . Stress: Not on file  Relationships  . Social connections:    Talks on phone: Not on file    Gets together: Not on file    Attends religious service: Not on file    Active member of club or organization: Not on file  Attends meetings of clubs or organizations: Not on file    Relationship status: Not on file  . Intimate partner violence:    Fear of current or ex partner: Not on file    Emotionally abused: Not on file    Physically abused: Not on file    Forced sexual activity: Not on file  Other Topics Concern  . Not on file  Social History Narrative  . Not on file    No outpatient medications have been marked as taking for the 12/19/18 encounter (Office Visit) with Jais Demir, Ilona SorrelAlicia B, PA-C.     ROS:  Review of Systems  Constitutional: Negative for fatigue, fever and unexpected weight change.  Respiratory: Negative for cough, shortness of breath and wheezing.   Cardiovascular: Negative for chest pain, palpitations and leg swelling.  Gastrointestinal: Negative for blood in stool, constipation, diarrhea, nausea and vomiting.  Endocrine: Negative for cold intolerance, heat intolerance and polyuria.   Genitourinary: Negative for dyspareunia, dysuria, flank pain, frequency, genital sores, hematuria, menstrual problem, pelvic pain, urgency, vaginal bleeding, vaginal discharge and vaginal pain.  Musculoskeletal: Negative for back pain, joint swelling and myalgias.  Skin: Negative for rash.  Neurological: Negative for dizziness, syncope, light-headedness, numbness and headaches.  Hematological: Negative for adenopathy.  Psychiatric/Behavioral: Negative for agitation, confusion, sleep disturbance and suicidal ideas. The patient is not nervous/anxious.      Objective: BP 140/90   Pulse 87   Ht 5\' 2"  (1.575 m)   Wt 150 lb (68 kg)   LMP 12/03/2018 (Approximate)   BMI 27.44 kg/m    Physical Exam Constitutional:      Appearance: She is well-developed.  Genitourinary:     Vagina, cervix, uterus, right adnexa and left adnexa normal.     Vulval condylomata present.     No vulval tenderness noted.        No vaginal discharge, erythema or tenderness.     No cervical polyp.     Uterus is not enlarged or tender.     No right or left adnexal mass present.     Right adnexa not tender.     Left adnexa not tender.     Genitourinary Comments: 2 WARTY LESIONS LT VULVA  Neck:     Musculoskeletal: Normal range of motion.     Thyroid: No thyromegaly.  Cardiovascular:     Rate and Rhythm: Normal rate and regular rhythm.     Heart sounds: Normal heart sounds. No murmur.  Pulmonary:     Effort: Pulmonary effort is normal.     Breath sounds: Normal breath sounds.  Chest:     Breasts:        Right: No mass, nipple discharge, skin change or tenderness.        Left: No mass, nipple discharge, skin change or tenderness.  Abdominal:     Palpations: Abdomen is soft.     Tenderness: There is no abdominal tenderness. There is no guarding.  Musculoskeletal: Normal range of motion.  Neurological:     Mental Status: She is alert and oriented to person, place, and time.     Cranial Nerves: No  cranial nerve deficit.  Psychiatric:        Behavior: Behavior normal.  Vitals signs reviewed.     RESULTS:  Results for orders placed or performed in visit on 12/19/18 (from the past 24 hour(s))  POCT Wet Prep with KOH     Status: Normal   Collection Time: 12/19/18 11:35 AM  Result  Value Ref Range   Trichomonas, UA Negative    Clue Cells Wet Prep HPF POC neg    Epithelial Wet Prep HPF POC     Yeast Wet Prep HPF POC neg    Bacteria Wet Prep HPF POC     RBC Wet Prep HPF POC     WBC Wet Prep HPF POC     KOH Prep POC Negative Negative    Assessment/Plan: Encounter for annual routine gynecological examination  Cervical cancer screening - Plan: Cytology - PAP  Screening for HPV (human papillomavirus) - Plan: Cytology - PAP  Vaginal itching - Neg wet prep/exam. Cont lotrimin crm prn. F/u prn.  - Plan: POCT Wet Prep with KOH  Genital warts - Treated with TCA. Wash in 4-6 hrs. F/u pnr.    GYN counsel breast self exam, adequate intake of calcium and vitamin D, diet and exercise     F/U  Return in about 1 year (around 12/20/2019).  Shanaiya Bene B. Gurdeep Keesey, PA-C 12/19/2018 11:36 AM

## 2018-12-19 NOTE — Patient Instructions (Signed)
I value your feedback and entrusting us with your care. If you get a [facility] patient survey, I would appreciate you taking the time to let us know about your experience today. Thank you! 

## 2018-12-21 LAB — CYTOLOGY - PAP
ADEQUACY: ABSENT
DIAGNOSIS: NEGATIVE
HPV (WINDOPATH): NOT DETECTED

## 2019-11-13 ENCOUNTER — Telehealth: Payer: Self-pay

## 2019-11-13 NOTE — Telephone Encounter (Signed)
Patient has questions for ABC about past exams. DH#[phone]

## 2019-11-13 NOTE — Telephone Encounter (Signed)
VM full. Unable to leave message.

## 2019-11-15 NOTE — Telephone Encounter (Signed)
VM full. Unable to leave message.

## 2019-11-21 NOTE — Telephone Encounter (Signed)
VM full. Unable to leave message. Closing after unable to reach patient after 3 attempts. No return call from patient.

## 2019-11-29 ENCOUNTER — Encounter: Payer: Self-pay | Admitting: Obstetrics and Gynecology

## 2019-11-29 ENCOUNTER — Other Ambulatory Visit: Payer: Self-pay

## 2019-11-29 ENCOUNTER — Other Ambulatory Visit (HOSPITAL_COMMUNITY)
Admission: RE | Admit: 2019-11-29 | Discharge: 2019-11-29 | Disposition: A | Discharge status: Home / Self Care | Payer: No Typology Code available for payment source | Source: Ambulatory Visit | Attending: Obstetrics and Gynecology | Admitting: Obstetrics and Gynecology

## 2019-11-29 ENCOUNTER — Ambulatory Visit (INDEPENDENT_AMBULATORY_CARE_PROVIDER_SITE_OTHER): Payer: BC Managed Care – PPO | Admitting: Obstetrics and Gynecology

## 2019-11-29 VITALS — BP 130/90 | Ht 62.0 in | Wt 152.0 lb

## 2019-11-29 DIAGNOSIS — Z113 Encounter for screening for infections with a predominantly sexual mode of transmission: Secondary | ICD-10-CM

## 2019-11-29 DIAGNOSIS — R350 Frequency of micturition: Secondary | ICD-10-CM | POA: Diagnosis not present

## 2019-11-29 NOTE — Progress Notes (Signed)
Patient, No Pcp Per   Chief Complaint  Patient presents with  . STD testing    HPI:      [name] is a 33 y.o. [id number] who LMP was Patient's last menstrual period was 11/08/2019 (exact date)., presents today for STD testing (vag swab only). No vag sx, known exposures but want to be safe. Only hx of STD is HPV. Has had urinary frequency with and without good flow, mild pelvic discomfort for past wk or so. No hematuria, dysuria, LBP. No hx of UTI in past. Can't give urine specimen today.    Patient Active Problem List   Diagnosis Date Noted  . Genital warts 12/09/2017  . Family history of breast cancer 12/09/2017  . Thyroid nodule 12/09/2017    Past Surgical History:  Procedure Laterality Date  . COLPOSCOPY    . CRYOTHERAPY      Family History  Problem Relation Age of Onset  . Hypertension Mother   . Thyroid disease Mother   . Hyperthyroidism Mother   . Multiple sclerosis Sister   . Breast cancer Maternal Aunt        26s  . Colon cancer Paternal Aunt     Social History   Socioeconomic History  . Marital status: Single    Spouse name: Not on file  . Number of children: Not on file  . Years of education: Not on file  . Highest education level: Not on file  Occupational History  . Not on file  Tobacco Use  . Smoking status: Never Smoker  . Smokeless tobacco: Never Used  Substance and Sexual Activity  . Alcohol use: Yes    Comment: occ  . Drug use: No  . Sexual activity: Yes    Birth control/protection: None  Other Topics Concern  . Not on file  Social History Narrative  . Not on file   Social Determinants of Health   Financial Resource Strain:   . Difficulty of Paying Living Expenses: Not on file  Food Insecurity:   . Worried About Programme researcher, broadcasting/film/video in the Last Year: Not on file  . Ran Out of Food in the Last Year: Not on file  Transportation Needs:   . Lack of Transportation (Medical): Not on file  . Lack of Transportation  (Non-Medical): Not on file  Physical Activity:   . Days of Exercise per Week: Not on file  . Minutes of Exercise per Session: Not on file  Stress:   . Feeling of Stress : Not on file  Social Connections:   . Frequency of Communication with Friends and Family: Not on file  . Frequency of Social Gatherings with Friends and Family: Not on file  . Attends Religious Services: Not on file  . Active Member of Clubs or Organizations: Not on file  . Attends Banker Meetings: Not on file  . Marital Status: Not on file  Intimate Partner Violence:   . Fear of Current or Ex-Partner: Not on file  . Emotionally Abused: Not on file  . Physically Abused: Not on file  . Sexually Abused: Not on file    No outpatient medications prior to visit.   No facility-administered medications prior to visit.      ROS:  Review of Systems  Constitutional: Negative for fever.  Gastrointestinal: Negative for blood in stool, constipation, diarrhea, nausea and vomiting.  Genitourinary: Positive for frequency. Negative for dyspareunia, dysuria, flank pain, hematuria, urgency, vaginal bleeding, vaginal discharge  and vaginal pain.  Musculoskeletal: Negative for back pain.  Skin: Negative for rash.   BREAST: No symptoms   OBJECTIVE:   Vitals:  BP 130/90   Ht 5\' 2"  (1.575 m)   Wt 152 lb (68.9 kg)   LMP 11/08/2019 (Exact Date)   BMI 27.80 kg/m   Physical Exam Vitals reviewed.  Constitutional:      Appearance: She is well-developed.  Pulmonary:     Effort: Pulmonary effort is normal.  Genitourinary:    General: Normal vulva.     Pubic Area: No rash.      Labia:        Right: No rash, tenderness or lesion.        Left: No rash, tenderness or lesion.      Vagina: Normal. No vaginal discharge, erythema or tenderness.     Cervix: Normal.     Uterus: Normal. Not enlarged and not tender.      Adnexa: Right adnexa normal and left adnexa normal.       Right: No mass or tenderness.          Left: No mass or tenderness.    Musculoskeletal:        General: Normal range of motion.     Cervical back: Normal range of motion.  Skin:    General: Skin is warm and dry.  Neurological:     General: No focal deficit present.     Mental Status: She is alert and oriented to person, place, and time.  Psychiatric:        Mood and Affect: Mood normal.        Behavior: Behavior normal.        Thought Content: Thought content normal.        Judgment: Judgment normal.     Assessment/Plan: Screening for STD (sexually transmitted disease) - Plan: [place] STD; Will call with results.   Urinary frequency--can't give UA today. Can RTO for UA drop off prn. F/u prn.     Return if symptoms worsen or fail to improve.  Graelyn Bihl B. Elanie Hammitt, PA-C 11/29/2019 5:00 PM

## 2019-11-29 NOTE — Patient Instructions (Signed)
I value your feedback and entrusting us with your care. If you get a [facility] patient survey, I would appreciate you taking the time to let us know about your experience today. Thank you!  As of November 23, 2019, your lab results will be released to your MyChart immediately, before I even have a chance to see them. Please give me time to review them and contact you if there are any abnormalities. Thank you for your patience.  

## 2019-12-01 LAB — CERVICOVAGINAL ANCILLARY ONLY
Chlamydia: NEGATIVE
Comment: NEGATIVE
Comment: NEGATIVE
Comment: NORMAL
Neisseria Gonorrhea: NEGATIVE
Trichomonas: NEGATIVE

## 2019-12-02 NOTE — Progress Notes (Signed)
Pls let pt know STD testing neg.

## 2019-12-04 NOTE — Progress Notes (Signed)
Called pt, no answer, could not leave voice msg due to mail box full. 

## 2019-12-04 NOTE — Progress Notes (Signed)
Pt aware.

## 2020-01-09 ENCOUNTER — Other Ambulatory Visit (HOSPITAL_COMMUNITY)
Admission: RE | Admit: 2020-01-09 | Discharge: 2020-01-09 | Disposition: A | Discharge status: Home / Self Care | Payer: No Typology Code available for payment source | Source: Ambulatory Visit | Attending: Obstetrics and Gynecology | Admitting: Obstetrics and Gynecology

## 2020-01-09 ENCOUNTER — Other Ambulatory Visit: Payer: Self-pay

## 2020-01-09 ENCOUNTER — Ambulatory Visit (INDEPENDENT_AMBULATORY_CARE_PROVIDER_SITE_OTHER): Payer: BC Managed Care – PPO | Admitting: Obstetrics and Gynecology

## 2020-01-09 ENCOUNTER — Encounter: Payer: Self-pay | Admitting: Obstetrics and Gynecology

## 2020-01-09 VITALS — BP 144/88 | HR 124 | Ht 62.0 in | Wt 147.0 lb

## 2020-01-09 DIAGNOSIS — O3680X0 Pregnancy with inconclusive fetal viability, not applicable or unspecified: Secondary | ICD-10-CM | POA: Diagnosis not present

## 2020-01-09 DIAGNOSIS — N912 Amenorrhea, unspecified: Secondary | ICD-10-CM

## 2020-01-09 DIAGNOSIS — Z113 Encounter for screening for infections with a predominantly sexual mode of transmission: Secondary | ICD-10-CM

## 2020-01-09 DIAGNOSIS — O209 Hemorrhage in early pregnancy, unspecified: Secondary | ICD-10-CM

## 2020-01-09 DIAGNOSIS — Z3201 Encounter for pregnancy test, result positive: Secondary | ICD-10-CM | POA: Diagnosis not present

## 2020-01-09 LAB — POCT URINE PREGNANCY: Preg Test, Ur: POSITIVE — AB

## 2020-01-09 NOTE — Progress Notes (Signed)
Obstetric Problem Visit    Chief Complaint:  Chief Complaint  Patient presents with  . Amenorrhea    Bleeding with positive pregnancy test    History of Present Illness: Patient is a 34 y.o. G0P0000 with Patient's last menstrual period was 12/06/2019.  Based on LMP EDD is 09/11/2020 and EGA is 4 weeks 6 days.  She is presenting for first trimester bleeding.  The onset of bleeding was earlier this week, and has been light, non-painful, but without inciting event.  Is bleeding equal to or greater than normal menstrual flow:  No Any recent trauma:  No Recent intercourse:  No History of prior miscarriage:  No Prior ultrasound demonstrating IUP: none.  Prior ultrasound demonstrating viable IUP:  No Prior Serum HCG:  No Rh status: unknown  Review of Systems: Review of Systems  Constitutional: Negative.   Gastrointestinal: Negative.   Genitourinary: Negative.     Past Medical History:  Past Medical History:  Diagnosis Date  . Abnormal Pap smear of cervix   . ASCUS with positive high risk HPV cervical   . Cervical dysplasia   . LGSIL on Pap smear of cervix   . Thyroid nodule     Past Surgical History:  Past Surgical History:  Procedure Laterality Date  . COLPOSCOPY    . CRYOTHERAPY      Obstetric History: G0P0000  Family History:  Family History  Problem Relation Age of Onset  . Hypertension Mother   . Thyroid disease Mother   . Hyperthyroidism Mother   . Multiple sclerosis Sister   . Breast cancer Maternal Aunt        46s  . Colon cancer Paternal Aunt     Social History:  Social History   Socioeconomic History  . Marital status: Single    Spouse name: Not on file  . Number of children: Not on file  . Years of education: Not on file  . Highest education level: Not on file  Occupational History  . Not on file  Tobacco Use  . Smoking status: Never Smoker  . Smokeless tobacco: Never Used  Substance and Sexual Activity  . Alcohol use: Yes    Comment:  occ  . Drug use: No  . Sexual activity: Yes    Birth control/protection: None  Other Topics Concern  . Not on file  Social History Narrative  . Not on file   Social Determinants of Health   Financial Resource Strain:   . Difficulty of Paying Living Expenses: Not on file  Food Insecurity:   . Worried About Charity fundraiser in the Last Year: Not on file  . Ran Out of Food in the Last Year: Not on file  Transportation Needs:   . Lack of Transportation (Medical): Not on file  . Lack of Transportation (Non-Medical): Not on file  Physical Activity:   . Days of Exercise per Week: Not on file  . Minutes of Exercise per Session: Not on file  Stress:   . Feeling of Stress : Not on file  Social Connections:   . Frequency of Communication with Friends and Family: Not on file  . Frequency of Social Gatherings with Friends and Family: Not on file  . Attends Religious Services: Not on file  . Active Member of Clubs or Organizations: Not on file  . Attends Archivist Meetings: Not on file  . Marital Status: Not on file  Intimate Partner Violence:   . Fear of Current or  Ex-Partner: Not on file  . Emotionally Abused: Not on file  . Physically Abused: Not on file  . Sexually Abused: Not on file    Allergies:  No Known Allergies  Medications: Prior to Admission medications   Not on File    Physical Exam Vitals: Blood pressure (!) 144/88, pulse (!) 124, height 5\' 2"  (1.575 m), weight 147 lb (66.7 kg), last menstrual period 12/06/2019.  General: NAD HEENT: normocephalic, anicteric Pulmonary: No increased work of breathing, Abdomen: soft, non-tender, non-distended.  Genitourinary:  External: Normal external female genitalia.  Normal urethral meatus, normal Bartholin's and Skene's glands.    Vagina: Normal vaginal mucosa, no evidence of prolapse.    Cervix: closed  Uterus:non-enlarged, moderate old blood noted in posterior vault  Adnexa: ovaries non-enlarged, no adnexal  masses  Rectal: deferred Extremities: no edema, erythema, or tenderness Neurologic: Grossly intact Psychiatric: mood appropriate, affect full  Assessment: 34 y.o. G0P0000 Unknown presenting for evaluation of first trimester vaginal bleeding  Plan: Problem List Items Addressed This Visit    None    Visit Diagnoses    Pregnancy, location unknown    -  Primary   Relevant Orders   Beta hCG quant (ref lab)   Progesterone   31 OB Comp Less 14 Wks   Beta hCG quant (ref lab)   POCT urine pregnancy (Completed)   Pregnancy with inconclusive fetal viability, single or unspecified fetus       Relevant Orders   Beta hCG quant (ref lab)   Progesterone   US OB Comp Less 14 Wks   Beta hCG quant (ref lab)   First trimester bleeding       Relevant Orders   Beta hCG quant (ref lab)   Progesterone   US OB Comp Less 14 Wks   Beta hCG quant (ref lab)   ABO   Antibody screen   Amenorrhea       Relevant Orders   POCT urine pregnancy (Completed)   Routine screening for STI (sexually transmitted infection)       Relevant Orders   Cervicovaginal ancillary only      1) First trimester bleeding - incidence and clinical course of first trimester bleeding is discussed in detail with the patient today.  Approximately 1/3 of pregnancies ending in live births experienced 1st trimester bleeding.  The amount of bleeding is variable and not necessarily predictive of outcome.  Sources may be cervical or uterine.  Subchorionic hemorrhages are a frequent concurrent findings on ultrasound and are followed expectantly.  These often absorb or regress spontaneously although risk for expansion and further disruption of the utero-placental interface leading to miscarriage is possible.  There is no clearly documented benefit to limiting or modifying activity and sexual intercourse in altering clinic course of 1st trimester bleeding.    2) If no already done will proceed with TVUS evaluation to document viability, and  if uncertain viability or absence of a demonstrable IUP (and no previous documentation of IUP) will trend HCG levels.  3) The patient is Rh status is unknown and was obtained today  4) Routine bleeding precautions were discussed with the patient prior the conclusion of today's visit.    [place], MD, [facility], [facility] 01/09/2020, 1:48 PM

## 2020-01-10 LAB — PROGESTERONE: Progesterone: 5.8 ng/mL

## 2020-01-10 LAB — BETA HCG QUANT (REF LAB): hCG Quant: 148 m[IU]/mL

## 2020-01-10 LAB — ABO

## 2020-01-10 LAB — ANTIBODY SCREEN: Antibody Screen: NEGATIVE

## 2020-01-10 NOTE — Progress Notes (Signed)
Can we add an Rh on to the type and screen looks like I only ordered the ABO

## 2020-01-11 ENCOUNTER — Ambulatory Visit (INDEPENDENT_AMBULATORY_CARE_PROVIDER_SITE_OTHER): Payer: BC Managed Care – PPO | Admitting: Obstetrics and Gynecology

## 2020-01-11 ENCOUNTER — Other Ambulatory Visit: Payer: Self-pay

## 2020-01-11 ENCOUNTER — Encounter: Payer: Self-pay | Admitting: Obstetrics and Gynecology

## 2020-01-11 ENCOUNTER — Ambulatory Visit (INDEPENDENT_AMBULATORY_CARE_PROVIDER_SITE_OTHER): Payer: BC Managed Care – PPO

## 2020-01-11 VITALS — BP 142/88 | Ht 62.0 in | Wt 145.0 lb

## 2020-01-11 DIAGNOSIS — O209 Hemorrhage in early pregnancy, unspecified: Secondary | ICD-10-CM

## 2020-01-11 DIAGNOSIS — O3680X0 Pregnancy with inconclusive fetal viability, not applicable or unspecified: Secondary | ICD-10-CM

## 2020-01-11 DIAGNOSIS — O0281 Inappropriate change in quantitative human chorionic gonadotropin (hCG) in early pregnancy: Secondary | ICD-10-CM | POA: Diagnosis not present

## 2020-01-11 DIAGNOSIS — Z3A01 Less than 8 weeks gestation of pregnancy: Secondary | ICD-10-CM | POA: Diagnosis not present

## 2020-01-11 DIAGNOSIS — O200 Threatened abortion: Secondary | ICD-10-CM

## 2020-01-11 DIAGNOSIS — O208 Other hemorrhage in early pregnancy: Secondary | ICD-10-CM

## 2020-01-11 LAB — CERVICOVAGINAL ANCILLARY ONLY
Chlamydia: NEGATIVE
Comment: NEGATIVE
Comment: NORMAL
Neisseria Gonorrhea: NEGATIVE

## 2020-01-11 NOTE — Progress Notes (Signed)
Gynecology Ultrasound Follow Up  Chief Complaint:  Chief Complaint  Patient presents with  . Follow-up    U/S follow up      History of Present Illness: Patient is a 34 y.o. female who presents today for ultrasound evaluation of first trimester bleeding.  Ultrasound demonstrates the following findgins Adnexa: normal Uterus: Non-enlarged without evidence of gestational sac Additional: no free fluid  Bleeding has subsided still with some light spotting.  HCG below discriminatory zone,  Progesterone <10 on labs 2 days ago.  Review of Systems: Review of Systems  Constitutional: Negative.   Gastrointestinal: Negative.   Genitourinary: Negative.     Past Medical History:  Past Medical History:  Diagnosis Date  . Abnormal Pap smear of cervix   . ASCUS with positive high risk HPV cervical   . Cervical dysplasia   . LGSIL on Pap smear of cervix   . Thyroid nodule     Past Surgical History:  Past Surgical History:  Procedure Laterality Date  . COLPOSCOPY    . CRYOTHERAPY      Gynecologic History:  Patient's last menstrual period was 01/05/2019 (exact date).   Family History:  Family History  Problem Relation Age of Onset  . Hypertension Mother   . Thyroid disease Mother   . Hyperthyroidism Mother   . Multiple sclerosis Sister   . Breast cancer Maternal Aunt        35s  . Colon cancer Paternal Aunt     Social History:  Social History   Socioeconomic History  . Marital status: Single    Spouse name: Not on file  . Number of children: Not on file  . Years of education: Not on file  . Highest education level: Not on file  Occupational History  . Not on file  Tobacco Use  . Smoking status: Never Smoker  . Smokeless tobacco: Never Used  Substance and Sexual Activity  . Alcohol use: Yes    Comment: occ  . Drug use: No  . Sexual activity: Yes    Birth control/protection: None  Other Topics Concern  . Not on file  Social History Narrative  . Not on  file   Social Determinants of Health   Financial Resource Strain:   . Difficulty of Paying Living Expenses: Not on file  Food Insecurity:   . Worried About Charity fundraiser in the Last Year: Not on file  . Ran Out of Food in the Last Year: Not on file  Transportation Needs:   . Lack of Transportation (Medical): Not on file  . Lack of Transportation (Non-Medical): Not on file  Physical Activity:   . Days of Exercise per Week: Not on file  . Minutes of Exercise per Session: Not on file  Stress:   . Feeling of Stress : Not on file  Social Connections:   . Frequency of Communication with Friends and Family: Not on file  . Frequency of Social Gatherings with Friends and Family: Not on file  . Attends Religious Services: Not on file  . Active Member of Clubs or Organizations: Not on file  . Attends Archivist Meetings: Not on file  . Marital Status: Not on file  Intimate Partner Violence:   . Fear of Current or Ex-Partner: Not on file  . Emotionally Abused: Not on file  . Physically Abused: Not on file  . Sexually Abused: Not on file    Allergies:  No Known Allergies  Medications: Prior  to Admission medications   Not on File    Physical Exam Vitals: Blood pressure (!) 142/88, height 5\' 2"  (1.575 m), weight 145 lb (65.8 kg), last menstrual period 01/05/2019.  General: NAD HEENT: normocephalic, anicteric Pulmonary: No increased work of breathing Extremities: no edema, erythema, or tenderness Neurologic: Grossly intact, normal gait Psychiatric: mood appropriate, affect full   Assessment: 34 y.o. G0P0000 pregnancy of unknown anatomic location Plan: Problem List Items Addressed This Visit    None    Visit Diagnoses    Pregnancy of unknown anatomic location    -  Primary   Relevant Orders   Beta hCG quant (ref lab)   Rh Type      1) Pregnancy of unknown anatomic location - favor complete abortion, will follow up with repeat HCG as early ectopic is also  in the differential.   Futher management will hinge on results of HCG rise/drop today.  2) A total of 15 minutes were spent in face-to-face contact with the patient during this encounter with over half of that time devoted to counseling and coordination of care.  3) Return if symptoms worsen or fail to improve.    31, MD, [facility], [facility] 01/11/2020, 2:31 PM

## 2020-01-12 LAB — SPECIMEN STATUS REPORT

## 2020-01-12 LAB — BETA HCG QUANT (REF LAB): hCG Quant: 89 m[IU]/mL

## 2020-01-12 LAB — RH TYPE: Rh Factor: POSITIVE

## 2020-01-30 DIAGNOSIS — R8290 Unspecified abnormal findings in urine: Secondary | ICD-10-CM | POA: Diagnosis not present

## 2020-02-01 DIAGNOSIS — N390 Urinary tract infection, site not specified: Secondary | ICD-10-CM | POA: Diagnosis not present

## 2020-04-26 DIAGNOSIS — M2550 Pain in unspecified joint: Secondary | ICD-10-CM | POA: Diagnosis not present

## 2020-10-08 DIAGNOSIS — Z0189 Encounter for other specified special examinations: Secondary | ICD-10-CM | POA: Diagnosis not present

## 2020-10-18 DIAGNOSIS — Z0189 Encounter for other specified special examinations: Secondary | ICD-10-CM | POA: Diagnosis not present

## 2020-10-23 DIAGNOSIS — E785 Hyperlipidemia, unspecified: Secondary | ICD-10-CM | POA: Diagnosis not present

## 2020-10-23 DIAGNOSIS — Z043 Encounter for examination and observation following other accident: Secondary | ICD-10-CM | POA: Diagnosis not present

## 2020-10-23 DIAGNOSIS — Z713 Dietary counseling and surveillance: Secondary | ICD-10-CM | POA: Diagnosis not present

## 2020-12-24 ENCOUNTER — Ambulatory Visit: Payer: BC Managed Care – PPO | Admitting: Obstetrics and Gynecology

## 2021-01-27 NOTE — Patient Instructions (Signed)
I value your feedback and you entrusting us with your care. If you get a [facility] patient survey, I would appreciate you taking the time to let us know about your experience today. Thank you! ? ? ?

## 2021-01-27 NOTE — Progress Notes (Signed)
PCP:  Patient, No Pcp Per   Chief Complaint  Patient presents with  . Gynecologic Exam    No concerns     HPI:      [name] is a 35 y.o. No obstetric history on file. who LMP was Patient's last menstrual period was 01/12/2021 (exact date)., presents today for her annual examination.  Her menses are regular every 28-30 days, lasting 5 days.  Dysmenorrhea mild. She does not have intermenstrual bleeding. S/p SAB 1/21  Sex activity: occas sexually active--contraception condoms, declines BC.  Last Pap: 12/19/18 and 12/09/17  Results were: no abnormalities /neg HPV DNA. Pt with LGSIL and neg bx 4/17. Repeat pap after 3 yrs per [facility] (2023). 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   There is a FH of breast cancer in her mat aunt, genetic testing not indicated. There is no FH of ovarian cancer. The patient does do self-breast exams.  Tobacco use: The patient denies current or previous tobacco use. Alcohol use: none No drug use.  Exercise: not active  She does get adequate calcium and Vitamin D in her diet.   Pt with hx of thyromegaly and thyroid nodules 2015/2016. Had u/s f/u 2017 that was normal without thyromegaly/nodules. Normal thyroid labs at work.   Labs at work.    Past Medical History:  Diagnosis Date  . Abnormal Pap smear of cervix   . ASCUS with positive high risk HPV cervical   . Cervical dysplasia   . LGSIL on Pap smear of cervix   . Thyroid nodule     Past Surgical History:  Procedure Laterality Date  . COLPOSCOPY    . CRYOTHERAPY      Family History  Problem Relation Age of Onset  . Hypertension Mother   . Thyroid disease Mother   . Hyperthyroidism Mother   . Multiple sclerosis Sister   . Breast cancer Maternal Aunt        30s  . Colon cancer Paternal Aunt     Social History   Socioeconomic History  . Marital status: Single    Spouse name: Not on file  . Number of children: Not on file  . Years of education: Not on file  . Highest  education level: Not on file  Occupational History  . Not on file  Tobacco Use  . Smoking status: Never Smoker  . Smokeless tobacco: Never Used  Vaping Use  . Vaping Use: Never used  Substance and Sexual Activity  . Alcohol use: Yes    Comment: occ  . Drug use: No  . Sexual activity: Yes    Birth control/protection: None, Condom  Other Topics Concern  . Not on file  Social History Narrative  . Not on file   Social Determinants of Health   Financial Resource Strain: Not on file  Food Insecurity: Not on file  Transportation Needs: Not on file  Physical Activity: Not on file  Stress: Not on file  Social Connections: Not on file  Intimate Partner Violence: Not on file    No outpatient medications have been marked as taking for the 01/28/21 encounter (Office Visit) with Ailene Royal B, PA-C.     ROS:  Review of Systems  Constitutional: Negative for fatigue, fever and unexpected weight change.  Respiratory: Negative for cough, shortness of breath and wheezing.   Cardiovascular: Negative for chest pain, palpitations and leg swelling.  Gastrointestinal: Negative for blood in stool, constipation, diarrhea, nausea and vomiting.  Endocrine: Negative for cold intolerance, heat intolerance and polyuria.  Genitourinary: Negative for dyspareunia, dysuria, flank pain, frequency, genital sores, hematuria, menstrual problem, pelvic pain, urgency, vaginal bleeding, vaginal discharge and vaginal pain.  Musculoskeletal: Negative for back pain, joint swelling and myalgias.  Skin: Negative for rash.  Neurological: Positive for headaches. Negative for dizziness, syncope, light-headedness and numbness.  Hematological: Negative for adenopathy.  Psychiatric/Behavioral: Negative for agitation, confusion, sleep disturbance and suicidal ideas. The patient is not nervous/anxious.      Objective: BP 130/80   Ht 5\' 2"  (1.575 m)   Wt 157 lb (71.2 kg)   LMP 01/12/2021 (Exact Date)   BMI 28.72  kg/m    Physical Exam Constitutional:      Appearance: She is well-developed.  Genitourinary:     Vulva normal.     Genitourinary Comments: 2 WARTY LESIONS LT VULVA     Right Labia: No rash, tenderness or lesions.    Left Labia: No tenderness, lesions or rash.    No vaginal discharge, erythema or tenderness.      Right Adnexa: not tender and no mass present.    Left Adnexa: not tender and no mass present.    No cervical friability or polyp.     Uterus is not enlarged or tender.  Breasts:     Right: No mass, nipple discharge, skin change or tenderness.     Left: No mass, nipple discharge, skin change or tenderness.    Neck:     Thyroid: No thyromegaly.  Cardiovascular:     Rate and Rhythm: Normal rate and regular rhythm.     Heart sounds: Normal heart sounds. No murmur heard.   Pulmonary:     Effort: Pulmonary effort is normal.     Breath sounds: Normal breath sounds.  Abdominal:     Palpations: Abdomen is soft.     Tenderness: There is no abdominal tenderness. There is no guarding or rebound.  Musculoskeletal:        General: Normal range of motion.     Cervical back: Normal range of motion.  Lymphadenopathy:     Cervical: No cervical adenopathy.  Neurological:     General: No focal deficit present.     Mental Status: She is alert and oriented to person, place, and time.     Cranial Nerves: No cranial nerve deficit.  Skin:    General: Skin is warm and dry.  Psychiatric:        Mood and Affect: Mood normal.        Behavior: Behavior normal.        Thought Content: Thought content normal.        Judgment: Judgment normal.  Vitals reviewed.     Assessment/Plan: Encounter for annual routine gynecological examination   GYN counsel breast self exam, adequate intake of calcium and vitamin D, diet and exercise     F/U  Return in about 1 year (around 01/28/2022).  Lantz Hermann B. Dirk Vanaman, PA-C 01/28/2021 8:30 AM

## 2021-01-28 ENCOUNTER — Encounter: Payer: Self-pay | Admitting: Obstetrics and Gynecology

## 2021-01-28 ENCOUNTER — Ambulatory Visit (INDEPENDENT_AMBULATORY_CARE_PROVIDER_SITE_OTHER): Payer: BC Managed Care – PPO | Admitting: Obstetrics and Gynecology

## 2021-01-28 ENCOUNTER — Other Ambulatory Visit: Payer: Self-pay

## 2021-01-28 VITALS — BP 130/80 | Ht 62.0 in | Wt 157.0 lb

## 2021-01-28 DIAGNOSIS — Z01419 Encounter for gynecological examination (general) (routine) without abnormal findings: Secondary | ICD-10-CM

## 2021-04-07 DIAGNOSIS — Z1152 Encounter for screening for COVID-19: Secondary | ICD-10-CM | POA: Diagnosis not present

## 2021-06-19 DIAGNOSIS — Z0189 Encounter for other specified special examinations: Secondary | ICD-10-CM | POA: Diagnosis not present

## 2021-06-23 DIAGNOSIS — E785 Hyperlipidemia, unspecified: Secondary | ICD-10-CM | POA: Diagnosis not present

## 2021-06-23 DIAGNOSIS — Z043 Encounter for examination and observation following other accident: Secondary | ICD-10-CM | POA: Diagnosis not present

## 2021-06-23 DIAGNOSIS — Z713 Dietary counseling and surveillance: Secondary | ICD-10-CM | POA: Diagnosis not present

## 2021-09-15 DIAGNOSIS — Z0189 Encounter for other specified special examinations: Secondary | ICD-10-CM | POA: Diagnosis not present

## 2021-09-16 DIAGNOSIS — E785 Hyperlipidemia, unspecified: Secondary | ICD-10-CM | POA: Diagnosis not present

## 2022-01-28 DIAGNOSIS — Z87410 Personal history of cervical dysplasia: Secondary | ICD-10-CM | POA: Insufficient documentation

## 2022-01-28 NOTE — Progress Notes (Signed)
PCP:  Patient, No Pcp Per (Inactive)   Chief Complaint  Patient presents with   Gynecologic Exam    No concerns     HPI:      [name] is a 36 y.o. No obstetric history on file. who LMP was Patient's last menstrual period was 01/03/2022 (exact date)., presents today for her annual examination.  Her menses are regular every 28-30 days, lasting 5 days.  Dysmenorrhea mild, improved with ibup. She does not have intermenstrual bleeding. S/p SAB 1/21  Sex activity: not sexually active--would like to start OCPs. Hasn't done [facility] in past. No hx of HTN, DVTs, migraines with aura.  Last Pap: 12/19/18 and 12/09/17  Results were: no abnormalities /neg HPV DNA. Pt with LGSIL and neg bx 4/17. Repeat pap after 3 yrs per [facility] (2023). 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  Has noticed some external lesions past few months. Wonder if warts. Hx of them in past. Not sex active for over 2 yrs. Would like treated if possible.   There is a FH of breast cancer in her mat aunt, genetic testing not indicated. There is no FH of ovarian cancer. The patient does self-breast exams.  Tobacco use: The patient denies current or previous tobacco use. Alcohol use: none No drug use.  Exercise: mod active  She does get adequate calcium and Vitamin D in her diet.   Pt with hx of thyromegaly and thyroid nodules 2015/2016. Had u/s f/u 2017 that was normal without thyromegaly/nodules. Normal thyroid labs at work yearly, last done 7/22.   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   Past Medical History:  Diagnosis Date   Abnormal Pap smear of cervix    ASCUS with positive high risk HPV cervical    Cervical dysplasia    LGSIL on Pap smear of cervix    Thyroid nodule     Past Surgical History:  Procedure Laterality Date   COLPOSCOPY     CRYOTHERAPY      Family History  Problem Relation Age of Onset   Hypertension Mother    Thyroid disease Mother    Hyperthyroidism Mother    Multiple sclerosis Sister    Breast  cancer Maternal Aunt        46s   Colon cancer Paternal Aunt     Social History   Socioeconomic History   Marital status: Single    Spouse name: Not on file   Number of children: Not on file   Years of education: Not on file   Highest education level: Not on file  Occupational History   Not on file  Tobacco Use   Smoking status: Never   Smokeless tobacco: Never  Vaping Use   Vaping Use: Never used  Substance and Sexual Activity   Alcohol use: Yes    Comment: occ   Drug use: No   Sexual activity: Not Currently    Birth control/protection: None  Other Topics Concern   Not on file  Social History Narrative   Not on file   Social Determinants of Health   Financial Resource Strain: Not on file  Food Insecurity: Not on file  Transportation Needs: Not on file  Physical Activity: Not on file  Stress: Not on file  Social Connections: Not on file  Intimate Partner Violence: Not on file    Current Meds  Medication Sig   Norethindrone Acetate-Ethinyl Estrad-FE (MICROGESTIN 24 FE) 1-20 MG-MCG(24) tablet Take 1 tablet by mouth daily.  ROS:  Review of Systems  Constitutional:  Negative for fatigue, fever and unexpected weight change.  Respiratory:  Negative for cough, shortness of breath and wheezing.   Cardiovascular:  Negative for chest pain, palpitations and leg swelling.  Gastrointestinal:  Negative for blood in stool, constipation, diarrhea, nausea and vomiting.  Endocrine: Negative for cold intolerance, heat intolerance and polyuria.  Genitourinary:  Negative for dyspareunia, dysuria, flank pain, frequency, genital sores, hematuria, menstrual problem, pelvic pain, urgency, vaginal bleeding, vaginal discharge and vaginal pain.  Musculoskeletal:  Negative for back pain, joint swelling and myalgias.  Skin:  Negative for rash.  Neurological:  Negative for dizziness, syncope, light-headedness, numbness and headaches.  Hematological:  Negative for adenopathy.   Psychiatric/Behavioral:  Negative for agitation, confusion, sleep disturbance and suicidal ideas. The patient is not nervous/anxious.     Objective: BP 118/70    Ht 5\' 3"  (1.6 m)    Wt 143 lb (64.9 kg)    LMP 01/03/2022 (Exact Date)    BMI 25.33 kg/m    Physical Exam Constitutional:      Appearance: She is well-developed.  Genitourinary:     Vulva normal.     Genitourinary Comments: 4 WARTY LESIONS LT VULVA     Right Labia: lesions.     Right Labia: No rash or tenderness.    Left Labia: lesions.     Left Labia: No tenderness or rash.       No vaginal discharge, erythema or tenderness.      Right Adnexa: not tender and no mass present.    Left Adnexa: not tender and no mass present.    No cervical friability or polyp.     Uterus is not enlarged or tender.  Breasts:    Right: No mass, nipple discharge, skin change or tenderness.     Left: No mass, nipple discharge, skin change or tenderness.  Neck:     Thyroid: No thyromegaly.  Cardiovascular:     Rate and Rhythm: Normal rate and regular rhythm.     Heart sounds: Normal heart sounds. No murmur heard. Pulmonary:     Effort: Pulmonary effort is normal.     Breath sounds: Normal breath sounds.  Abdominal:     Palpations: Abdomen is soft.     Tenderness: There is no abdominal tenderness. There is no guarding or rebound.  Musculoskeletal:        General: Normal range of motion.     Cervical back: Normal range of motion.  Lymphadenopathy:     Cervical: No cervical adenopathy.  Neurological:     General: No focal deficit present.     Mental Status: She is alert and oriented to person, place, and time.     Cranial Nerves: No cranial nerve deficit.  Skin:    General: Skin is warm and dry.  Psychiatric:        Mood and Affect: Mood normal.        Behavior: Behavior normal.        Thought Content: Thought content normal.        Judgment: Judgment normal.  Vitals reviewed.    Assessment/Plan: Encounter for annual  routine gynecological examination  Cervical cancer screening - Plan: Cytology - PAP  Screening for HPV (human papillomavirus) - Plan: Cytology - PAP  History of cervical dysplasia - Plan: Cytology - PAP; repeat pap today.  Encounter for initial prescription of contraceptive pills - Plan: Norethindrone Acetate-Ethinyl Estrad-FE (MICROGESTIN 24 FE) 1-20 MG-MCG(24) tablet; BC options discussed,  pt would like to start OCPs. Rx eRxd. Start with next menses, condoms.   Thyroid nodule--labs done at work, no u/s done.   Genital warts--treated with TCA. Wash in 4-6 hrs, wound care as appropriate. F/u prn.   Meds ordered this encounter  Medications   Norethindrone Acetate-Ethinyl Estrad-FE (MICROGESTIN 24 FE) 1-20 MG-MCG(24) tablet    Sig: Take 1 tablet by mouth daily.    Dispense:  84 tablet    Refill:  3    Order Specific Question:   Supervising Provider    Answer:   Gae Dry [id number]    GYN counsel breast self exam, adequate intake of calcium and vitamin D, diet and exercise     F/U  Return in about 1 year (around 01/29/2023).  Cindy Shambaugh B. Zakyla Tonche, PA-C 01/29/2022 9:34 AM

## 2022-01-29 ENCOUNTER — Ambulatory Visit (INDEPENDENT_AMBULATORY_CARE_PROVIDER_SITE_OTHER): Payer: BC Managed Care – PPO | Admitting: Obstetrics and Gynecology

## 2022-01-29 ENCOUNTER — Other Ambulatory Visit: Payer: Self-pay

## 2022-01-29 ENCOUNTER — Encounter: Payer: Self-pay | Admitting: Obstetrics and Gynecology

## 2022-01-29 ENCOUNTER — Other Ambulatory Visit (HOSPITAL_COMMUNITY)
Admission: RE | Admit: 2022-01-29 | Discharge: 2022-01-29 | Disposition: A | Discharge status: Home / Self Care | Payer: BC Managed Care – PPO | Source: Ambulatory Visit | Attending: Obstetrics and Gynecology | Admitting: Obstetrics and Gynecology

## 2022-01-29 VITALS — BP 118/70 | Ht 63.0 in | Wt 143.0 lb

## 2022-01-29 DIAGNOSIS — A630 Anogenital (venereal) warts: Secondary | ICD-10-CM

## 2022-01-29 DIAGNOSIS — Z124 Encounter for screening for malignant neoplasm of cervix: Secondary | ICD-10-CM | POA: Diagnosis not present

## 2022-01-29 DIAGNOSIS — Z01419 Encounter for gynecological examination (general) (routine) without abnormal findings: Secondary | ICD-10-CM

## 2022-01-29 DIAGNOSIS — Z87410 Personal history of cervical dysplasia: Secondary | ICD-10-CM | POA: Diagnosis not present

## 2022-01-29 DIAGNOSIS — E041 Nontoxic single thyroid nodule: Secondary | ICD-10-CM

## 2022-01-29 DIAGNOSIS — Z1151 Encounter for screening for human papillomavirus (HPV): Secondary | ICD-10-CM

## 2022-01-29 DIAGNOSIS — Z30011 Encounter for initial prescription of contraceptive pills: Secondary | ICD-10-CM

## 2022-01-29 MED ORDER — MICROGESTIN 24 FE 1-20 MG-MCG PO TABS: 1.0000 | ORAL_TABLET | Freq: Every day | ORAL | 3 refills | Status: DC

## 2022-01-29 NOTE — Patient Instructions (Signed)
I value your feedback and you entrusting us with your care. If you get a [facility] patient survey, I would appreciate you taking the time to let us know about your experience today. Thank you! ? ? ?

## 2022-02-02 LAB — CYTOLOGY - PAP
Comment: NEGATIVE
High risk HPV: NEGATIVE

## 2022-02-06 ENCOUNTER — Encounter: Payer: Self-pay | Admitting: Obstetrics and Gynecology

## 2022-07-02 DIAGNOSIS — Z0189 Encounter for other specified special examinations: Secondary | ICD-10-CM | POA: Diagnosis not present

## 2022-07-03 ENCOUNTER — Other Ambulatory Visit: Payer: Self-pay | Admitting: Family Medicine

## 2022-07-03 DIAGNOSIS — E049 Nontoxic goiter, unspecified: Secondary | ICD-10-CM

## 2022-07-03 DIAGNOSIS — Z713 Dietary counseling and surveillance: Secondary | ICD-10-CM | POA: Diagnosis not present

## 2022-07-03 DIAGNOSIS — Z0131 Encounter for examination of blood pressure with abnormal findings: Secondary | ICD-10-CM | POA: Diagnosis not present

## 2022-07-03 DIAGNOSIS — Z043 Encounter for examination and observation following other accident: Secondary | ICD-10-CM | POA: Diagnosis not present

## 2022-07-13 ENCOUNTER — Ambulatory Visit
Admission: RE | Admit: 2022-07-13 | Discharge: 2022-07-13 | Disposition: A | Discharge status: Home / Self Care | Payer: BC Managed Care – PPO | Source: Ambulatory Visit | Attending: Family Medicine | Admitting: Family Medicine

## 2022-07-13 DIAGNOSIS — E049 Nontoxic goiter, unspecified: Secondary | ICD-10-CM | POA: Insufficient documentation

## 2023-02-13 DIAGNOSIS — R87612 Low grade squamous intraepithelial lesion on cytologic smear of cervix (LGSIL): Secondary | ICD-10-CM | POA: Insufficient documentation

## 2023-02-13 NOTE — Progress Notes (Unsigned)
PCP:  Patient, No Pcp Per   No chief complaint on file.    HPI:      [name] is a 37 y.o. No obstetric history on file. who LMP was No LMP recorded., presents today for her annual examination.  Her menses are regular every 28-30 days, lasting 5 days.  Dysmenorrhea mild, improved with ibup. She does not have intermenstrual bleeding. S/p SAB 1/21  Sex activity: not sexually active--would like to start OCPs. Hasn't done [facility] in past. No hx of HTN, DVTs, migraines with aura.  Last Pap: 01/29/22 Results were LGISL, neg HPV DNA. Repeat pap in 1 yr per [facility]. 12/19/18 and 12/09/17  Results were: no abnormalities /neg HPV DNA. Pt with LGSIL and neg bx 4/17.  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  Has noticed some external lesions past few months. Wonder if warts. Hx of them in past. Not sex active for over 2 yrs. Would like treated if possible.   There is a FH of breast cancer in her mat aunt, genetic testing not indicated. There is no FH of ovarian cancer. The patient does self-breast exams.  Tobacco use: The patient denies current or previous tobacco use. Alcohol use: none No drug use.  Exercise: mod active  She does get adequate calcium and Vitamin D in her diet.   Pt with hx of thyromegaly and thyroid nodules 2015/2016. Had u/s f/u 2017 that was normal without thyromegaly/nodules. Normal thyroid labs at work yearly, last done 7/22.   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   Past Medical History:  Diagnosis Date   Abnormal Pap smear of cervix    ASCUS with positive high risk HPV cervical    Cervical dysplasia    LGSIL on Pap smear of cervix    Thyroid nodule     Past Surgical History:  Procedure Laterality Date   COLPOSCOPY     CRYOTHERAPY      Family History  Problem Relation Age of Onset   Hypertension Mother    Thyroid disease Mother    Hyperthyroidism Mother    Multiple sclerosis Sister    Breast cancer Maternal Aunt        41s   Colon cancer Paternal Aunt     Social  History   Socioeconomic History   Marital status: Single    Spouse name: Not on file   Number of children: Not on file   Years of education: Not on file   Highest education level: Not on file  Occupational History   Not on file  Tobacco Use   Smoking status: Never   Smokeless tobacco: Never  Vaping Use   Vaping Use: Never used  Substance and Sexual Activity   Alcohol use: Yes    Comment: occ   Drug use: No   Sexual activity: Not Currently    Birth control/protection: None  Other Topics Concern   Not on file  Social History Narrative   Not on file   Social Determinants of Health   Financial Resource Strain: Not on file  Food Insecurity: Not on file  Transportation Needs: Not on file  Physical Activity: Not on file  Stress: Not on file  Social Connections: Not on file  Intimate Partner Violence: Not on file    No outpatient medications have been marked as taking for the 02/15/23 encounter (Appointment) with Damichael Hofman, Elmo Putt B, PA-C.     ROS:  Review of Systems  Constitutional:  Negative for fatigue,  fever and unexpected weight change.  Respiratory:  Negative for cough, shortness of breath and wheezing.   Cardiovascular:  Negative for chest pain, palpitations and leg swelling.  Gastrointestinal:  Negative for blood in stool, constipation, diarrhea, nausea and vomiting.  Endocrine: Negative for cold intolerance, heat intolerance and polyuria.  Genitourinary:  Negative for dyspareunia, dysuria, flank pain, frequency, genital sores, hematuria, menstrual problem, pelvic pain, urgency, vaginal bleeding, vaginal discharge and vaginal pain.  Musculoskeletal:  Negative for back pain, joint swelling and myalgias.  Skin:  Negative for rash.  Neurological:  Negative for dizziness, syncope, light-headedness, numbness and headaches.  Hematological:  Negative for adenopathy.  Psychiatric/Behavioral:  Negative for agitation, confusion, sleep disturbance and suicidal ideas. The  patient is not nervous/anxious.      Objective: There were no vitals taken for this visit.   Physical Exam Constitutional:      Appearance: She is well-developed.  Genitourinary:     Vulva normal.     Genitourinary Comments: 4 WARTY LESIONS LT VULVA     Right Labia: lesions.     Right Labia: No rash or tenderness.    Left Labia: lesions.     Left Labia: No tenderness or rash.    No vaginal discharge, erythema or tenderness.      Right Adnexa: not tender and no mass present.    Left Adnexa: not tender and no mass present.    No cervical friability or polyp.     Uterus is not enlarged or tender.  Breasts:    Right: No mass, nipple discharge, skin change or tenderness.     Left: No mass, nipple discharge, skin change or tenderness.  Neck:     Thyroid: No thyromegaly.  Cardiovascular:     Rate and Rhythm: Normal rate and regular rhythm.     Heart sounds: Normal heart sounds. No murmur heard. Pulmonary:     Effort: Pulmonary effort is normal.     Breath sounds: Normal breath sounds.  Abdominal:     Palpations: Abdomen is soft.     Tenderness: There is no abdominal tenderness. There is no guarding or rebound.  Musculoskeletal:        General: Normal range of motion.     Cervical back: Normal range of motion.  Lymphadenopathy:     Cervical: No cervical adenopathy.  Neurological:     General: No focal deficit present.     Mental Status: She is alert and oriented to person, place, and time.     Cranial Nerves: No cranial nerve deficit.  Skin:    General: Skin is warm and dry.  Psychiatric:        Mood and Affect: Mood normal.        Behavior: Behavior normal.        Thought Content: Thought content normal.        Judgment: Judgment normal.  Vitals reviewed.     Assessment/Plan: Encounter for annual routine gynecological examination  Cervical cancer screening - Plan: Cytology - PAP  Screening for HPV (human papillomavirus) - Plan: Cytology - PAP  History of  cervical dysplasia - Plan: Cytology - PAP; repeat pap today.  Encounter for initial prescription of contraceptive pills - Plan: Norethindrone Acetate-Ethinyl Estrad-FE (MICROGESTIN 24 FE) 1-20 MG-MCG(24) tablet; BC options discussed, pt would like to start OCPs. Rx eRxd. Start with next menses, condoms.   Thyroid nodule--labs done at work, no u/s done.   Genital warts--treated with TCA. Wash in 4-6 hrs, wound care as  appropriate. F/u prn.   No orders of the defined types were placed in this encounter.   GYN counsel breast self exam, adequate intake of calcium and vitamin D, diet and exercise     F/U  No follow-ups on file.  Kinzlee Selvy B. Acey Woodfield, PA-C 02/13/2023 11:38 AM

## 2023-02-15 ENCOUNTER — Ambulatory Visit (INDEPENDENT_AMBULATORY_CARE_PROVIDER_SITE_OTHER): Payer: BC Managed Care – PPO | Admitting: Obstetrics and Gynecology

## 2023-02-15 ENCOUNTER — Other Ambulatory Visit (HOSPITAL_COMMUNITY)
Admission: RE | Admit: 2023-02-15 | Discharge: 2023-02-15 | Disposition: A | Discharge status: Home / Self Care | Payer: BC Managed Care – PPO | Source: Ambulatory Visit | Attending: Obstetrics and Gynecology | Admitting: Obstetrics and Gynecology

## 2023-02-15 ENCOUNTER — Encounter: Payer: Self-pay | Admitting: Obstetrics and Gynecology

## 2023-02-15 VITALS — BP 120/70 | Ht 63.0 in | Wt 149.0 lb

## 2023-02-15 DIAGNOSIS — Z124 Encounter for screening for malignant neoplasm of cervix: Secondary | ICD-10-CM

## 2023-02-15 DIAGNOSIS — Z1151 Encounter for screening for human papillomavirus (HPV): Secondary | ICD-10-CM | POA: Insufficient documentation

## 2023-02-15 DIAGNOSIS — Z3041 Encounter for surveillance of contraceptive pills: Secondary | ICD-10-CM

## 2023-02-15 DIAGNOSIS — R87612 Low grade squamous intraepithelial lesion on cytologic smear of cervix (LGSIL): Secondary | ICD-10-CM | POA: Insufficient documentation

## 2023-02-15 DIAGNOSIS — E041 Nontoxic single thyroid nodule: Secondary | ICD-10-CM

## 2023-02-15 DIAGNOSIS — Z01419 Encounter for gynecological examination (general) (routine) without abnormal findings: Secondary | ICD-10-CM

## 2023-02-15 DIAGNOSIS — A630 Anogenital (venereal) warts: Secondary | ICD-10-CM

## 2023-02-15 NOTE — Patient Instructions (Signed)
I value your feedback and you entrusting us with your care. If you get a [facility] patient survey, I would appreciate you taking the time to let us know about your experience today. Thank you! ? ? ?

## 2023-02-17 LAB — CYTOLOGY - PAP
Comment: NEGATIVE
Diagnosis: UNDETERMINED — AB
High risk HPV: NEGATIVE

## 2024-02-12 DIAGNOSIS — Z1371 Encounter for nonprocreative screening for genetic disease carrier status: Secondary | ICD-10-CM

## 2024-02-12 DIAGNOSIS — Z9189 Other specified personal risk factors, not elsewhere classified: Secondary | ICD-10-CM

## 2024-02-12 HISTORY — DX: Other specified personal risk factors, not elsewhere classified: Z91.89

## 2024-02-12 HISTORY — DX: Encounter for nonprocreative screening for genetic disease carrier status: Z13.71

## 2024-02-16 DIAGNOSIS — Z8742 Personal history of other diseases of the female genital tract: Secondary | ICD-10-CM | POA: Insufficient documentation

## 2024-02-16 NOTE — Progress Notes (Unsigned)
 PCP:  Patient, No Pcp Per   No chief complaint on file.    HPI:      [name] is a 38 y.o. No obstetric history on file. who LMP was No LMP recorded., presents today for her annual examination.  Her menses are regular every 28-30 days, lasting 5 days.  Dysmenorrhea mild, improved with ibup. She does not have intermenstrual bleeding. S/p SAB 1/21  Sex activity: not sexually active--started OCPs last yr but stopped them since no need. No side effects. No hx of HTN, DVTs, migraines with aura.  Last Pap: 02/15/23 Results were ASCUS/neg HPV DNA;  01/29/22 Results were LGISL, neg HPV DNA. 12/19/18 and 12/09/17  Results were: no abnormalities /neg HPV DNA.  Pt with LGSIL and neg bx 4/17.  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  Ext genital warts resolved last yr with TCA tx. No current sx.   There is a FH of breast cancer in 2 mat aunts and 1 pat aunt, genetic testing not indicated. There is no FH of ovarian cancer. The patient does self-breast exams.  Tobacco use: The patient denies current or previous tobacco use. Alcohol use: none No drug use.  Exercise: not active  She does get adequate calcium but not Vitamin D in her diet. Hx of Vitamin D deficiency on recent labs.    Pt with hx of thyromegaly and thyroid nodules 2015/2016. Had u/s f/u 2017 that was normal without thyromegaly/nodules; no f/u needed. Normal thyroid labs at work yearly, last done recently.    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 Just started on atorvastatin for hyperlipidemia.   Past Medical History:  Diagnosis Date   Abnormal Pap smear of cervix    ASCUS with positive high risk HPV cervical    Cervical dysplasia    LGSIL on Pap smear of cervix    Thyroid nodule     Past Surgical History:  Procedure Laterality Date   COLPOSCOPY     CRYOTHERAPY      Family History  Problem Relation Age of Onset   Hypertension Mother    Thyroid disease Mother    Hyperthyroidism Mother    Multiple sclerosis Sister    Breast  cancer Maternal Aunt        34s   Breast cancer Maternal Aunt        60s   Colon cancer Paternal Aunt     Social History   Socioeconomic History   Marital status: Single    Spouse name: Not on file   Number of children: Not on file   Years of education: Not on file   Highest education level: Not on file  Occupational History   Not on file  Tobacco Use   Smoking status: Never   Smokeless tobacco: Never  Vaping Use   Vaping status: Never Used  Substance and Sexual Activity   Alcohol use: Yes    Comment: occ   Drug use: No   Sexual activity: Not Currently    Birth control/protection: None  Other Topics Concern   Not on file  Social History Narrative   Not on file   Social Drivers of Health   Financial Resource Strain: Not on file  Food Insecurity: Not on file  Transportation Needs: Not on file  Physical Activity: Not on file  Stress: Not on file  Social Connections: Not on file  Intimate Partner Violence: Not on file    No outpatient medications have been marked as  taking for the 02/17/24 encounter (Appointment) with Kuper Rennels, Ilona Sorrel, PA-C.     ROS:  Review of Systems  Constitutional:  Negative for fatigue, fever and unexpected weight change.  Respiratory:  Negative for cough, shortness of breath and wheezing.   Cardiovascular:  Negative for chest pain, palpitations and leg swelling.  Gastrointestinal:  Negative for blood in stool, constipation, diarrhea, nausea and vomiting.  Endocrine: Negative for cold intolerance, heat intolerance and polyuria.  Genitourinary:  Negative for dyspareunia, dysuria, flank pain, frequency, genital sores, hematuria, menstrual problem, pelvic pain, urgency, vaginal bleeding, vaginal discharge and vaginal pain.  Musculoskeletal:  Negative for back pain, joint swelling and myalgias.  Skin:  Negative for rash.  Neurological:  Negative for dizziness, syncope, light-headedness, numbness and headaches.  Hematological:  Negative for  adenopathy.  Psychiatric/Behavioral:  Negative for agitation, confusion, sleep disturbance and suicidal ideas. The patient is not nervous/anxious.      Objective: There were no vitals taken for this visit.   Physical Exam Constitutional:      Appearance: She is well-developed.  Genitourinary:     Vulva normal.     Right Labia: No rash, tenderness or lesions.    Left Labia: No tenderness, lesions or rash.    No vaginal discharge, erythema or tenderness.      Right Adnexa: not tender and no mass present.    Left Adnexa: not tender and no mass present.    No cervical friability or polyp.     Uterus is not enlarged or tender.  Breasts:    Right: No mass, nipple discharge, skin change or tenderness.     Left: No mass, nipple discharge, skin change or tenderness.  Neck:     Thyroid: No thyromegaly.  Cardiovascular:     Rate and Rhythm: Normal rate and regular rhythm.     Heart sounds: Normal heart sounds. No murmur heard. Pulmonary:     Effort: Pulmonary effort is normal.     Breath sounds: Normal breath sounds.  Abdominal:     Palpations: Abdomen is soft.     Tenderness: There is no abdominal tenderness. There is no guarding or rebound.  Musculoskeletal:        General: Normal range of motion.     Cervical back: Normal range of motion.  Lymphadenopathy:     Cervical: No cervical adenopathy.  Neurological:     General: No focal deficit present.     Mental Status: She is alert and oriented to person, place, and time.     Cranial Nerves: No cranial nerve deficit.  Skin:    General: Skin is warm and dry.  Psychiatric:        Mood and Affect: Mood normal.        Behavior: Behavior normal.        Thought Content: Thought content normal.        Judgment: Judgment normal.  Vitals reviewed.     Assessment/Plan: Encounter for annual routine gynecological examination  Cervical cancer screening - Plan: Cytology - PAP  Screening for HPV (human papillomavirus) - Plan:  Cytology - PAP  LGSIL on Pap smear of cervix - Plan: Cytology - PAP; will f/u with results.   GYN counsel breast self exam, adequate intake of calcium and vitamin D, diet and exercise     F/U  No follow-ups on file.  Aidan Moten B. Shlonda Dolloff, PA-C 02/16/2024 5:26 PM

## 2024-02-17 ENCOUNTER — Telehealth: Payer: Self-pay

## 2024-02-17 ENCOUNTER — Encounter: Payer: Self-pay | Admitting: Obstetrics and Gynecology

## 2024-02-17 ENCOUNTER — Ambulatory Visit: Payer: BC Managed Care – PPO | Admitting: Obstetrics and Gynecology

## 2024-02-17 ENCOUNTER — Other Ambulatory Visit (HOSPITAL_COMMUNITY)
Admission: RE | Admit: 2024-02-17 | Discharge: 2024-02-17 | Disposition: A | Discharge status: Home / Self Care | Source: Ambulatory Visit | Attending: Obstetrics and Gynecology | Admitting: Obstetrics and Gynecology

## 2024-02-17 VITALS — BP 151/97 | HR 99 | Ht 63.0 in | Wt 159.0 lb

## 2024-02-17 DIAGNOSIS — R030 Elevated blood-pressure reading, without diagnosis of hypertension: Secondary | ICD-10-CM

## 2024-02-17 DIAGNOSIS — Z01419 Encounter for gynecological examination (general) (routine) without abnormal findings: Secondary | ICD-10-CM

## 2024-02-17 DIAGNOSIS — Z803 Family history of malignant neoplasm of breast: Secondary | ICD-10-CM | POA: Diagnosis not present

## 2024-02-17 DIAGNOSIS — Z8742 Personal history of other diseases of the female genital tract: Secondary | ICD-10-CM | POA: Insufficient documentation

## 2024-02-17 DIAGNOSIS — Z124 Encounter for screening for malignant neoplasm of cervix: Secondary | ICD-10-CM | POA: Insufficient documentation

## 2024-02-17 DIAGNOSIS — Z1151 Encounter for screening for human papillomavirus (HPV): Secondary | ICD-10-CM

## 2024-02-17 DIAGNOSIS — Z808 Family history of malignant neoplasm of other organs or systems: Secondary | ICD-10-CM | POA: Diagnosis not present

## 2024-02-17 DIAGNOSIS — Z113 Encounter for screening for infections with a predominantly sexual mode of transmission: Secondary | ICD-10-CM

## 2024-02-17 DIAGNOSIS — E041 Nontoxic single thyroid nodule: Secondary | ICD-10-CM

## 2024-02-17 DIAGNOSIS — Z800 Family history of malignant neoplasm of digestive organs: Secondary | ICD-10-CM | POA: Diagnosis not present

## 2024-02-17 NOTE — Telephone Encounter (Signed)
 Pt can f/u with nurse at work for rechecks. They are managing cholesterol meds, so can they manage blood pressure too with meds if still high? Pt to let me know.

## 2024-02-17 NOTE — Telephone Encounter (Signed)
 Blood pressure rechecked around 2:10, and it was 148/98. Please advise patient of next steps, she does not have a PCP.

## 2024-02-17 NOTE — Patient Instructions (Signed)
 I value your feedback and you entrusting [place] with your care. If you get a [facility] patient survey, I would appreciate you taking the time to let us know about your experience today. Thank you! ? ? ?

## 2024-02-18 LAB — HIV ANTIBODY (ROUTINE TESTING W REFLEX): HIV Screen 4th Generation wRfx: NONREACTIVE

## 2024-02-18 NOTE — Telephone Encounter (Signed)
 Pt aware.

## 2024-02-22 LAB — LAB REPORT - SCANNED
A1c: 5.3
EGFR: 113
Free T4: 6.8 ng/dL
TSH: 0.95 (ref 0.41–5.90)

## 2024-02-23 LAB — CYTOLOGY - PAP
Adequacy: ABSENT
Comment: NEGATIVE
Diagnosis: NEGATIVE
High risk HPV: NEGATIVE

## 2024-02-29 ENCOUNTER — Encounter: Payer: Self-pay | Admitting: Obstetrics and Gynecology

## 2024-03-16 ENCOUNTER — Encounter: Payer: Self-pay | Admitting: Obstetrics and Gynecology

## 2024-03-16 ENCOUNTER — Telehealth: Payer: Self-pay | Admitting: Obstetrics and Gynecology

## 2024-03-16 NOTE — Telephone Encounter (Signed)
 Pt aware of neg MyRisk results. IBIS=26.3%/riskscore=24.3%. Pt aware of recommendations of monthly SBE, yearly CBE and mammos, as well as scr breast MRI. Will call for MRI ref prn. Pt to do mammo through work, will call for order prn.   Patient understands these results only apply to her and her children, and this is not indicative of genetic testing results of her other family members. It is recommended that her other family members have genetic testing done.  Pt also understands negative genetic testing doesn't mean she will never get any of these cancers.   Hard copy mailed to pt. F/u prn.

## 2024-04-21 ENCOUNTER — Encounter: Payer: Self-pay | Admitting: Obstetrics and Gynecology

## 2024-04-24 ENCOUNTER — Other Ambulatory Visit: Payer: Self-pay | Admitting: Obstetrics and Gynecology

## 2024-04-24 DIAGNOSIS — Z803 Family history of malignant neoplasm of breast: Secondary | ICD-10-CM

## 2024-04-24 DIAGNOSIS — Z1231 Encounter for screening mammogram for malignant neoplasm of breast: Secondary | ICD-10-CM

## 2024-04-24 DIAGNOSIS — Z9189 Other specified personal risk factors, not elsewhere classified: Secondary | ICD-10-CM

## 2024-05-02 ENCOUNTER — Encounter: Payer: Self-pay | Admitting: General Practice

## 2024-05-02 ENCOUNTER — Ambulatory Visit: Admitting: General Practice

## 2024-05-02 VITALS — BP 114/78 | HR 106 | Temp 98.3°F | Ht 62.1 in | Wt 155.0 lb

## 2024-05-02 DIAGNOSIS — Z800 Family history of malignant neoplasm of digestive organs: Secondary | ICD-10-CM | POA: Diagnosis not present

## 2024-05-02 DIAGNOSIS — I10 Essential (primary) hypertension: Secondary | ICD-10-CM | POA: Insufficient documentation

## 2024-05-02 DIAGNOSIS — E782 Mixed hyperlipidemia: Secondary | ICD-10-CM | POA: Insufficient documentation

## 2024-05-02 DIAGNOSIS — Z803 Family history of malignant neoplasm of breast: Secondary | ICD-10-CM

## 2024-05-02 DIAGNOSIS — Z1211 Encounter for screening for malignant neoplasm of colon: Secondary | ICD-10-CM

## 2024-05-02 DIAGNOSIS — Z7689 Persons encountering health services in other specified circumstances: Secondary | ICD-10-CM | POA: Insufficient documentation

## 2024-05-02 NOTE — Patient Instructions (Signed)
 Schedule mammogram.   Referral placed for colonoscopy. Please let me know if you don't hear anything within the next two weeks.   Continue Amlodipine 10 mg once daily. Keep me posted regarding ankle swelling.   Continue Atorvastatin 10 mg once daily. Please eat a low fat low cholesterol diet. Increase exercise.   Follow up in 6 months.   It was a pleasure to meet you today! Please don't hesitate to contact me with any questions. Welcome to [facility]!

## 2024-05-02 NOTE — Assessment & Plan Note (Addendum)
 Controlled.   Continue amlodipine 10 mg once daily.  She will update when she needs refills.  Reviewed labs patient brought with her.

## 2024-05-02 NOTE — Assessment & Plan Note (Signed)
 Two maternal aunts with breast cancer.  Discussed the importance of breast cancer screening.  She will call and make an appointment.

## 2024-05-02 NOTE — Progress Notes (Signed)
 New Patient Office Visit  Subjective    Patient ID: Cindy Conley, female    DOB: March 22, 1986  Age: 38 y.o. MRN: [id number]  CC:  Chief Complaint  Patient presents with   New Patient (Initial Visit)    HPI Cindy Conley is a 38 y.o. female presents to establish care.  Previous PCP/physical/labs: previously being seen by GYN. Last physical with GYN on 02/17/24.  Hypertension: diagnosed this year. She does not check her BP at home. She was started on medication at work by NP. She was previously lisinopril 10 mg which was discontinued due to cough. She was then started on Amlodipine 10 mg once daily. She has intermittent ankle edema which is resolved with elevation. She had a CMP completed on 02/21/24 which was within normal limits.   HLD: diagnosed last year. She is currently managed on Lipitor 10 mg once daily. She had lipid panel on 02/21/24 which showed elevated total cholesterol and elevated LDL. She is not consistent with her diet. However she reports trying to do better with her exercise. HDL was 68. Her A1c was 5.3 in march, 2025. She denies any chest pain, shortness of breath or difficulty breathing. She does report that she does not take her medication daily.  Her vitamin d levels, b 12 levels, CBC, TSH were all within normal range on 02/21/24.   Family history of breast cancer: she has maternal aunts with breast cancer; one of her aunts was diagnosed in her late 45s. Patient has not had a mammogram. She has an order from GYN but hasn't scheduled the appt.   Family history of colon cancer: she has a paternal aunt who was diagnosed with colon cancer in her 50s.   Outpatient Encounter Medications as of 05/02/2024  Medication Sig   amLODipine (NORVASC) 10 MG tablet Take 10 mg by mouth daily.   atorvastatin (LIPITOR) 10 MG tablet Take 10 mg by mouth daily.   [DISCONTINUED] lisinopril (ZESTRIL) 10 MG tablet Take 10 mg by mouth daily. (Patient not taking: Reported on 05/02/2024)    No facility-administered encounter medications on file as of 05/02/2024.    Past Medical History:  Diagnosis Date   Abnormal Pap smear of cervix    ASCUS with positive high risk HPV cervical    BRCA negative 02/2024   MyRisk neg   Cervical dysplasia    Family history of breast cancer    Increased risk of breast cancer 02/2024   IBIS=26.3%/riskscore=24.3%   LGSIL on Pap smear of cervix    Thyroid  nodule     Past Surgical History:  Procedure Laterality Date   COLPOSCOPY     CRYOTHERAPY      Family History  Problem Relation Age of Onset   Hypertension Mother    Thyroid  disease Mother    Hyperthyroidism Mother    Multiple sclerosis Sister    Breast cancer Maternal Aunt        32s   Breast cancer Maternal Aunt        5s   Colon cancer Paternal Aunt    Pancreatic cancer Maternal Grandmother        7s    Social History   Socioeconomic History   Marital status: Single    Spouse name: Not on file   Number of children: Not on file   Years of education: Not on file   Highest education level: Not on file  Occupational History   Not on file  Tobacco Use  Smoking status: Never   Smokeless tobacco: Never  Vaping Use   Vaping status: Never Used  Substance and Sexual Activity   Alcohol use: Yes    Comment: occ   Drug use: No   Sexual activity: Not Currently    Birth control/protection: None  Other Topics Concern   Not on file  Social History Narrative   Not on file   Social Drivers of Health   Financial Resource Strain: Not on file  Food Insecurity: Not on file  Transportation Needs: Not on file  Physical Activity: Not on file  Stress: Not on file  Social Connections: Not on file  Intimate Partner Violence: Not on file    Review of Systems  Constitutional:  Negative for chills and fever.  Respiratory:  Negative for shortness of breath.   Cardiovascular:  Negative for chest pain.  Gastrointestinal:  Negative for abdominal pain, constipation,  diarrhea, heartburn, nausea and vomiting.  Genitourinary:  Negative for dysuria, frequency and urgency.  Neurological:  Negative for dizziness and headaches.  Endo/Heme/Allergies:  Negative for polydipsia.  Psychiatric/Behavioral:  Negative for depression and suicidal ideas. The patient is not nervous/anxious.         Objective    BP 114/78 (BP Location: Left Arm, Patient Position: Sitting, Cuff Size: Normal)   Pulse (!) 106   Temp 98.3 F (36.8 C) (Oral)   Ht 5' 2.1" (1.577 m)   Wt 155 lb (70.3 kg)   LMP 04/20/2024 (Exact Date)   SpO2 99%   BMI 28.26 kg/m   Physical Exam Vitals and nursing note reviewed.  Constitutional:      Appearance: Normal appearance.  Cardiovascular:     Rate and Rhythm: Normal rate and regular rhythm.     Pulses: Normal pulses.     Heart sounds: Normal heart sounds.  Pulmonary:     Effort: Pulmonary effort is normal.     Breath sounds: Normal breath sounds.  Neurological:     Mental Status: She is alert and oriented to person, place, and time.  Psychiatric:        Mood and Affect: Mood normal.        Behavior: Behavior normal.        Thought Content: Thought content normal.        Judgment: Judgment normal.         Assessment & Plan:  Primary hypertension Assessment & Plan: Controlled.   Continue amlodipine 10 mg once daily.  She will update when she needs refills.  Reviewed labs patient brought with her.    Establishing care with new doctor, encounter for  Screening for colon cancer -     Ambulatory referral to Gastroenterology  Family history of colon cancer Assessment & Plan: History of colon cancer in paternal aunt.   Referral placed for colonoscopy. Discussed the importance of screening.  Orders: -     Ambulatory referral to Gastroenterology  Family history of breast cancer Assessment & Plan: Two maternal aunts with breast cancer.  Discussed the importance of breast cancer screening.  She will call and make an  appointment.   Mixed hyperlipidemia Assessment & Plan: Uncontrolled.  Discussed the importance of medication adherence, lifestyle modification.  She will have repeat lipid panel in July.  Continue Atorvastatin 10 mg once daily.  Follow up in 6 months. Reviewed labs patient brought with her.     Return in about 6 months (around 11/02/2024) for hypertension and HLD. Jolanda Nation, NP

## 2024-05-02 NOTE — Assessment & Plan Note (Signed)
 Uncontrolled.  Discussed the importance of medication adherence, lifestyle modification.  She will have repeat lipid panel in July.  Continue Atorvastatin 10 mg once daily.  Follow up in 6 months. Reviewed labs patient brought with her.

## 2024-05-02 NOTE — Assessment & Plan Note (Signed)
 History of colon cancer in paternal aunt.   Referral placed for colonoscopy. Discussed the importance of screening.

## 2024-05-16 DIAGNOSIS — H35363 Drusen (degenerative) of macula, bilateral: Secondary | ICD-10-CM | POA: Diagnosis not present

## 2024-05-26 ENCOUNTER — Encounter: Payer: Self-pay | Admitting: Physician Assistant

## 2024-06-22 DIAGNOSIS — Z0189 Encounter for other specified special examinations: Secondary | ICD-10-CM | POA: Diagnosis not present

## 2024-06-23 ENCOUNTER — Ambulatory Visit
Admission: RE | Admit: 2024-06-23 | Discharge: 2024-06-23 | Disposition: A | Discharge status: Home / Self Care | Source: Ambulatory Visit | Attending: Obstetrics and Gynecology | Admitting: Obstetrics and Gynecology

## 2024-06-23 DIAGNOSIS — Z803 Family history of malignant neoplasm of breast: Secondary | ICD-10-CM | POA: Diagnosis not present

## 2024-06-23 DIAGNOSIS — Z9189 Other specified personal risk factors, not elsewhere classified: Secondary | ICD-10-CM | POA: Insufficient documentation

## 2024-06-23 DIAGNOSIS — Z1231 Encounter for screening mammogram for malignant neoplasm of breast: Secondary | ICD-10-CM | POA: Insufficient documentation

## 2024-06-26 DIAGNOSIS — Z043 Encounter for examination and observation following other accident: Secondary | ICD-10-CM | POA: Diagnosis not present

## 2024-06-27 ENCOUNTER — Ambulatory Visit: Payer: Self-pay | Admitting: Obstetrics and Gynecology

## 2024-07-10 NOTE — Progress Notes (Unsigned)
 Cindy Console, PA-C [address] [place], [place]  [address] Phone: [phone]   Gastroenterology Consultation  Referring Provider:     Vincente Shivers, NP Primary Care Physician:  Vincente Shivers, NP Primary Gastroenterologist:  Cindy Console, PA-C / Elspeth Naval, MD  Reason for Consultation:     Discuss colonoscopy, family history of colon cancer        HPI:   Cindy Conley is a 38 y.o. y/o female referred for consultation & management  by Vincente Shivers, NP.    New patient.  She is here to discuss early colonoscopy.  She has family history of paternal aunt diagnosed with colon cancer in her early 68s.  No other relatives have had colon cancer.  She has no family history of colon cancer in a first-degree relative.  Her parents and siblings have not had any colon polyps.  No previous GI evaluation, EGD, or colonoscopy.  Current symptoms: None.  Patient denies abdominal pain, diarrhea, constipation, rectal bleeding, or weight loss.  She has no anemia.  02/2024 last labs: Normal CMP, TSH, and CBC (Hgb 13.8).  Past Medical History:  Diagnosis Date   Abnormal Pap smear of cervix    ASCUS with positive high risk HPV cervical    BRCA negative 02/2024   MyRisk neg   Cervical dysplasia    Elevated cholesterol    Family history of breast cancer    Hyperlipidemia    Hypertension    Increased risk of breast cancer 02/2024   IBIS=26.3%/riskscore=24.3%   LGSIL on Pap smear of cervix    Thyroid  nodule     Past Surgical History:  Procedure Laterality Date   COLPOSCOPY     CRYOTHERAPY      Prior to Admission medications   Medication Sig Start Date End Date Taking? Authorizing Provider  amLODipine (NORVASC) 10 MG tablet Take 10 mg by mouth daily. 03/06/24   [provider]  atorvastatin (LIPITOR) 10 MG tablet Take 10 mg by mouth daily. 01/14/23   [provider]    Family History  Problem Relation Age of Onset   Hypertension Mother    Thyroid   disease Mother    Hyperthyroidism Mother    Multiple sclerosis Sister    Pancreatic cancer Maternal Grandmother        24s   Breast cancer Maternal Aunt        62s   Breast cancer Maternal Aunt        60s   Colon cancer Paternal Aunt    Liver disease Neg Hx    Esophageal cancer Neg Hx      Social History   Tobacco Use   Smoking status: Never   Smokeless tobacco: Never  Vaping Use   Vaping status: Never Used  Substance Use Topics   Alcohol use: Yes    Comment: occ   Drug use: No    Allergies as of 07/11/2024   (No Known Allergies)    Review of Systems:    All systems reviewed and negative except where noted in HPI.   Physical Exam:  BP 120/68   Pulse (!) 103   Ht 5' 1 (1.549 m)   Wt 151 lb (68.5 kg)   LMP 06/15/2024   BMI 28.53 kg/m  Patient's last menstrual period was 06/15/2024.  General:   Alert,  Well-developed, well-nourished, pleasant and cooperative in NAD Lungs:  Respirations even and unlabored.  Clear throughout to auscultation.   No wheezes, crackles,  or rhonchi. No acute distress. Heart:  Regular rate and rhythm; no murmurs, clicks, rubs, or gallops. Abdomen:  Normal bowel sounds.  No bruits.  Soft, and non-distended without masses, hepatosplenomegaly or hernias noted.  No Tenderness.  No guarding or rebound tenderness.    Neurologic:  Alert and oriented x3;  grossly normal neurologically. Psych:  Alert and cooperative. Normal mood and affect.  Imaging Studies: MM 3D SCREENING MAMMOGRAM BILATERAL BREAST Result Date: 06/27/2024 CLINICAL DATA:  Screening. EXAM: DIGITAL SCREENING BILATERAL MAMMOGRAM WITH TOMOSYNTHESIS AND CAD TECHNIQUE: Bilateral screening digital craniocaudal and mediolateral oblique mammograms were obtained. Bilateral screening digital breast tomosynthesis was performed. The images were evaluated with computer-aided detection. COMPARISON:  None available. ACR Breast Density Category b: There are scattered areas of fibroglandular density.  FINDINGS: There are no findings suspicious for malignancy. IMPRESSION: No mammographic evidence of malignancy. A result letter of this screening mammogram will be mailed directly to the patient. RECOMMENDATION: Screening mammogram in one year. (Code:SM-B-01Y) BI-RADS CATEGORY  1: Negative. Electronically Signed   By: Dirk Arrant M.D.   On: 06/27/2024 16:11     Assessment and Plan:   [name] is a 38 y.o. y/o female has been referred for:  1.  Family history of colon cancer in (One) Second Degree Relative: Maternal aunt early 36s.  Patient has no first-degree relatives with colon cancer or colon polyps.  Although CRC occurring only in distant relatives has been associated with an increased risk of CRC in family members, the magnitude of the increase in risk (relative risk [RR] 1.82, 95% CI 1.47-2.25) is not large enough to warrant more screening than is recommended for the general population - UpToDate.  2.  Colon Cancer Screening: Guidelines were discussed at length. - First Screening Colonoscopy is recommended at Age 28. - If she develops family history of a first-degree relative (mother, father, siblings) who have colon cancer or precancerous colon polyps at a young age, then colonoscopy is recommended 10 years prior to the relative's age of diagnosis.   Follow up at age 84 to schedule colonoscopy or sooner if patient develops any GI symptoms such as rectal bleeding, change in bowel habits, or iron deficiency anemia.  Cindy Console, PA-C

## 2024-07-11 ENCOUNTER — Encounter: Payer: Self-pay | Admitting: Physician Assistant

## 2024-07-11 ENCOUNTER — Ambulatory Visit: Admitting: Physician Assistant

## 2024-07-11 VITALS — BP 120/68 | HR 103 | Ht 61.0 in | Wt 151.0 lb

## 2024-07-11 DIAGNOSIS — Z800 Family history of malignant neoplasm of digestive organs: Secondary | ICD-10-CM | POA: Diagnosis not present

## 2024-07-11 NOTE — Patient Instructions (Signed)
 Please give us  a call to follow up in 5 years to schedule a Colonoscopy  Please follow up sooner if symptoms increase or worsen  Due to recent changes in healthcare laws, you may see the results of your imaging and laboratory studies on MyChart before your provider has had a chance to review them.  We understand that in some cases there may be results that are confusing or concerning to you. Not all laboratory results come back in the same time frame and the provider may be waiting for multiple results in order to interpret others.  Please give us  48 hours in order for your provider to thoroughly review all the results before contacting the office for clarification of your results.   Thank you for trusting me with your gastrointestinal care!   Ellouise Console, PA-C _______________________________________________________  If your blood pressure at your visit was 140/90 or greater, please contact your primary care physician to follow up on this.  _______________________________________________________  If you are age 40 or older, your body mass index should be between 23-30. Your Body mass index is 28.53 kg/m. If this is out of the aforementioned range listed, please consider follow up with your Primary Care Provider.  If you are age 70 or younger, your body mass index should be between 19-25. Your Body mass index is 28.53 kg/m. If this is out of the aformentioned range listed, please consider follow up with your Primary Care Provider.   ________________________________________________________  The [facility] providers would like to encourage you to use MYCHART to communicate with providers for non-urgent requests or questions.  Due to long hold times on the telephone, sending your provider a message by [facility] may be a faster and more efficient way to get a response.  Please allow 48 business hours for a response.  Please remember that this is for non-urgent requests.   _______________________________________________________

## 2024-07-11 NOTE — Progress Notes (Signed)
 Agree with your recommendations.  Her family history does not warrant a colonoscopy sooner than age 40 unless she has symptoms that would warrant it or IDA etc.  Thanks

## 2024-07-17 ENCOUNTER — Encounter: Payer: Self-pay | Admitting: Obstetrics and Gynecology

## 2024-10-09 ENCOUNTER — Encounter: Payer: Self-pay | Admitting: General Practice

## 2024-11-02 ENCOUNTER — Ambulatory Visit: Admitting: General Practice

## 2024-11-02 ENCOUNTER — Encounter: Payer: Self-pay | Admitting: General Practice

## 2024-11-02 VITALS — BP 136/72 | HR 100 | Temp 98.9°F | Ht 61.0 in | Wt 158.4 lb

## 2024-11-02 DIAGNOSIS — I10 Essential (primary) hypertension: Secondary | ICD-10-CM

## 2024-11-02 DIAGNOSIS — E782 Mixed hyperlipidemia: Secondary | ICD-10-CM | POA: Diagnosis not present

## 2024-11-02 LAB — COMPREHENSIVE METABOLIC PANEL WITH GFR
ALT: 14 U/L (ref 0–35)
AST: 17 U/L (ref 0–37)
Albumin: 4.3 g/dL (ref 3.5–5.2)
Alkaline Phosphatase: 42 U/L (ref 39–117)
BUN: 9 mg/dL (ref 6–23)
CO2: 28 meq/L (ref 19–32)
Calcium: 9 mg/dL (ref 8.4–10.5)
Chloride: 104 meq/L (ref 96–112)
Creatinine, Ser: 0.68 mg/dL (ref 0.40–1.20)
GFR: 108.98 mL/min (ref >60.00)
Glucose, Bld: 105 mg/dL — ABNORMAL HIGH (ref 70–99)
Potassium: 3.4 meq/L — ABNORMAL LOW (ref 3.5–5.1)
Sodium: 138 meq/L (ref 135–145)
Total Bilirubin: 0.7 mg/dL (ref 0.2–1.2)
Total Protein: 7.1 g/dL (ref 6.0–8.3)

## 2024-11-02 LAB — LIPID PANEL
Cholesterol: 189 mg/dL (ref 0–200)
HDL: 58.5 mg/dL (ref >39.00)
LDL Cholesterol: 111 mg/dL — ABNORMAL HIGH (ref 0–99)
NonHDL: 130.6
Total CHOL/HDL Ratio: 3
Triglycerides: 99 mg/dL (ref 0.0–149.0)
VLDL: 19.8 mg/dL (ref 0.0–40.0)

## 2024-11-02 NOTE — Progress Notes (Signed)
 Established Patient Office Visit  Subjective   Patient ID: Cindy Conley, female    DOB: 11-Feb-1986  Age: 38 y.o. MRN: [id number]  Chief Complaint  Patient presents with   Medical Management of Chronic Issues    6 Month f/u: HTN, HDL    HPI  Cindy Conley is  38 year old female with past medical history of HTN, thyroid  nodule, HLD presents today for a six month follow up on HTN, HLD.  Discussed the use of AI scribe software for clinical note transcription with the patient, who gave verbal consent to proceed.  History of Present Illness Cindy Conley is a 38 year old female with hypertension and hyperlipidemia who presents for a follow-up visit.  She has a history of hypertension and is currently taking amlodipine. She does not consistently monitor her blood pressure at home. She reports that her blood pressure reading at home one to two weeks ago was similar to today's reading, with her mother also noting that the top number was somewhat high. No symptoms such as blurred vision, headaches, chest pain, shortness of breath, or ankle swelling.   She has a history of hyperlipidemia and has been on atorvastatin (Lipitor).  She is not currently monitoring her diet but is attempting to maintain regular exercise. Her A1c was normal in March.     Patient Active Problem List   Diagnosis Date Noted   Establishing care with new doctor, encounter for 05/02/2024   Mixed hyperlipidemia 05/02/2024   Primary hypertension 05/02/2024   Family history of colon cancer 05/02/2024   History of abnormal cervical Pap smear 02/16/2024   LGSIL on Pap smear of cervix 02/13/2023   History of cervical dysplasia 01/28/2022   Genital warts 12/09/2017   Family history of breast cancer 12/09/2017   Thyroid  nodule 12/09/2017   Past Medical History:  Diagnosis Date   Abnormal Pap smear of cervix    ASCUS with positive high risk HPV cervical    BRCA negative 02/2024   MyRisk neg   Cervical  dysplasia    Elevated cholesterol    Family history of breast cancer    Hyperlipidemia    Hypertension    Increased risk of breast cancer 02/2024   IBIS=26.3%/riskscore=24.3%   LGSIL on Pap smear of cervix    Thyroid  nodule    Past Surgical History:  Procedure Laterality Date   COLPOSCOPY     CRYOTHERAPY     No Known Allergies       05/02/2024   10:19 AM  Depression screen PHQ 2/9  Decreased Interest 0  Down, Depressed, Hopeless 0  PHQ - 2 Score 0  Altered sleeping 0  Tired, decreased energy 2  Change in appetite 1  Feeling bad or failure about yourself  0  Trouble concentrating 0  Moving slowly or fidgety/restless 0  Suicidal thoughts 0  PHQ-9 Score 3   Difficult doing work/chores Not difficult at all     Data saved with a previous flowsheet row definition       05/02/2024   10:19 AM  GAD 7 : Generalized Anxiety Score  Nervous, Anxious, on Edge 0  Control/stop worrying 1  Worry too much - different things 1  Trouble relaxing 0  Restless 0  Easily annoyed or irritable 1  Afraid - awful might happen 0  Total GAD 7 Score 3  Anxiety Difficulty Not difficult at all      Review of Systems  Constitutional:  Negative for  chills and fever.  Respiratory:  Negative for shortness of breath.   Cardiovascular:  Negative for chest pain.  Gastrointestinal:  Negative for abdominal pain, constipation, diarrhea, heartburn, nausea and vomiting.  Genitourinary:  Negative for dysuria, frequency and urgency.  Neurological:  Negative for dizziness and headaches.  Endo/Heme/Allergies:  Negative for polydipsia.  Psychiatric/Behavioral:  Negative for depression and suicidal ideas. The patient is not nervous/anxious.       Objective:     BP 136/72 (BP Location: Left Arm, Patient Position: Sitting, Cuff Size: Normal)   Pulse 100   Temp 98.9 F (37.2 C) (Oral)   Ht 5' 1 (1.549 m)   Wt 158 lb 6 oz (71.8 kg)   LMP  (LMP Unknown)   SpO2 99%   BMI 29.92 kg/m  BP Readings  from Last 3 Encounters:  11/02/24 136/72  07/11/24 120/68  05/02/24 114/78   Wt Readings from Last 3 Encounters:  11/02/24 158 lb 6 oz (71.8 kg)  07/11/24 151 lb (68.5 kg)  05/02/24 155 lb (70.3 kg)      Physical Exam Vitals and nursing note reviewed.  Constitutional:      Appearance: Normal appearance.  Cardiovascular:     Rate and Rhythm: Normal rate and regular rhythm.     Pulses: Normal pulses.     Heart sounds: Normal heart sounds.  Pulmonary:     Effort: Pulmonary effort is normal.     Breath sounds: Normal breath sounds.  Neurological:     Mental Status: She is alert and oriented to person, place, and time.  Psychiatric:        Mood and Affect: Mood normal.        Behavior: Behavior normal.        Thought Content: Thought content normal.        Judgment: Judgment normal.      No results found for any visits on 11/02/24.     The ASCVD Risk score (Arnett DK, et al., 2019) failed to calculate for the following reasons:   Cannot find a previous HDL lab   Cannot find a previous total cholesterol lab    Assessment & Plan:  Primary hypertension -     Comprehensive metabolic panel with GFR  Mixed hyperlipidemia -     Lipid panel    Assessment and Plan Assessment & Plan Primary hypertension Blood pressure slightly elevated at 136/72 mmHg. No symptoms reported. Currently on amlodipine 10 mg daily. - Continue amlodipine 10 mg oral daily. - Monitor blood pressure at home twice a week, rest 30 minutes before measurement. - Advised on lifestyle modifications: reduce sodium intake, increase exercise. - Instructed to report symptoms such as blurred vision or headaches.  Mixed hyperlipidemia On atorvastatin 10 mg daily. No recent cholesterol check due to non-fasting. - Ordered fasting lipid panel. - Continue atorvastatin 10 mg oral daily. - Provided handout on cholesterol management.   Return in about 6 months (around 05/02/2025) for physical and fasting labs.  SABRA Carrol Aurora, NP

## 2024-11-02 NOTE — Patient Instructions (Addendum)
 Stop by the lab prior to leaving today. I will notify you of your results once received.    Continue monitoring diet and exercise.   Follow up in May for physical and fasting labs.   It was a pleasure to see you today!

## 2024-11-03 ENCOUNTER — Ambulatory Visit: Payer: Self-pay | Admitting: General Practice

## 2025-03-01 ENCOUNTER — Ambulatory Visit: Admitting: Licensed Practical Nurse

## 2025-05-01 ENCOUNTER — Encounter: Admitting: General Practice
